# Patient Record
Sex: Male | Born: 1955 | Race: White | Hispanic: No | Marital: Married | Smoking: Former smoker
Health system: Southern US, Community
[De-identification: ages and names within clinical notes are randomized; demographics above are authoritative.]

## PROBLEM LIST (undated history)

## (undated) DIAGNOSIS — E119 Type 2 diabetes mellitus without complications: Secondary | ICD-10-CM

## (undated) DIAGNOSIS — R351 Nocturia: Secondary | ICD-10-CM

## (undated) DIAGNOSIS — J449 Chronic obstructive pulmonary disease, unspecified: Secondary | ICD-10-CM

## (undated) DIAGNOSIS — K219 Gastro-esophageal reflux disease without esophagitis: Secondary | ICD-10-CM

## (undated) DIAGNOSIS — D229 Melanocytic nevi, unspecified: Secondary | ICD-10-CM

## (undated) DIAGNOSIS — Z973 Presence of spectacles and contact lenses: Secondary | ICD-10-CM

## (undated) DIAGNOSIS — M1990 Unspecified osteoarthritis, unspecified site: Secondary | ICD-10-CM

## (undated) DIAGNOSIS — Z85828 Personal history of other malignant neoplasm of skin: Secondary | ICD-10-CM

## (undated) DIAGNOSIS — E0590 Thyrotoxicosis, unspecified without thyrotoxic crisis or storm: Secondary | ICD-10-CM

## (undated) DIAGNOSIS — C4491 Basal cell carcinoma of skin, unspecified: Secondary | ICD-10-CM

## (undated) DIAGNOSIS — E785 Hyperlipidemia, unspecified: Secondary | ICD-10-CM

## (undated) DIAGNOSIS — E0510 Thyrotoxicosis with toxic single thyroid nodule without thyrotoxic crisis or storm: Secondary | ICD-10-CM

## (undated) DIAGNOSIS — I10 Essential (primary) hypertension: Secondary | ICD-10-CM

## (undated) DIAGNOSIS — Z9889 Other specified postprocedural states: Secondary | ICD-10-CM

## (undated) HISTORY — PX: COLONOSCOPY: SHX174

## (undated) HISTORY — PX: LAPAROSCOPIC CHOLECYSTECTOMY: SUR755

## (undated) HISTORY — DX: Hyperlipidemia, unspecified: E78.5

## (undated) HISTORY — PX: KNEE ARTHROSCOPY: SUR90

## (undated) HISTORY — DX: Essential (primary) hypertension: I10

## (undated) HISTORY — DX: Gastro-esophageal reflux disease without esophagitis: K21.9

---
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
## 2003-10-28 ENCOUNTER — Emergency Department (HOSPITAL_COMMUNITY): Admission: EM | Admit: 2003-10-28 | Discharge: 2003-10-28 | Payer: Self-pay | Admitting: *Deleted

## 2003-10-29 ENCOUNTER — Ambulatory Visit (HOSPITAL_COMMUNITY): Admission: RE | Admit: 2003-10-29 | Discharge: 2003-10-29 | Payer: Self-pay | Admitting: Family Medicine

## 2003-10-31 ENCOUNTER — Ambulatory Visit (HOSPITAL_COMMUNITY): Admission: RE | Admit: 2003-10-31 | Discharge: 2003-10-31 | Payer: Self-pay | Admitting: Family Medicine

## 2003-11-06 ENCOUNTER — Emergency Department (HOSPITAL_COMMUNITY): Admission: EM | Admit: 2003-11-06 | Discharge: 2003-11-07 | Payer: Self-pay | Admitting: *Deleted

## 2003-11-08 ENCOUNTER — Ambulatory Visit (HOSPITAL_COMMUNITY): Admission: RE | Admit: 2003-11-08 | Discharge: 2003-11-08 | Payer: Self-pay | Admitting: General Surgery

## 2005-10-11 ENCOUNTER — Emergency Department (HOSPITAL_COMMUNITY): Admission: EM | Admit: 2005-10-11 | Discharge: 2005-10-11 | Payer: Self-pay | Admitting: Emergency Medicine

## 2006-03-14 ENCOUNTER — Ambulatory Visit (HOSPITAL_COMMUNITY): Payer: Self-pay | Admitting: Orthopedic Surgery

## 2006-03-14 ENCOUNTER — Ambulatory Visit: Payer: Self-pay | Admitting: Orthopedic Surgery

## 2006-03-14 ENCOUNTER — Encounter (HOSPITAL_COMMUNITY): Admission: RE | Admit: 2006-03-14 | Discharge: 2006-04-01 | Payer: Self-pay | Admitting: Orthopedic Surgery

## 2006-03-14 ENCOUNTER — Encounter: Payer: Self-pay | Admitting: Orthopedic Surgery

## 2006-03-16 ENCOUNTER — Ambulatory Visit: Payer: Self-pay | Admitting: Orthopedic Surgery

## 2006-03-21 ENCOUNTER — Ambulatory Visit: Payer: Self-pay | Admitting: Orthopedic Surgery

## 2006-03-28 ENCOUNTER — Ambulatory Visit: Payer: Self-pay | Admitting: Orthopedic Surgery

## 2007-02-07 ENCOUNTER — Encounter (INDEPENDENT_AMBULATORY_CARE_PROVIDER_SITE_OTHER): Payer: Self-pay | Admitting: General Surgery

## 2007-02-07 ENCOUNTER — Ambulatory Visit (HOSPITAL_COMMUNITY): Admission: RE | Admit: 2007-02-07 | Discharge: 2007-02-07 | Payer: Self-pay | Admitting: General Surgery

## 2007-12-05 ENCOUNTER — Ambulatory Visit (HOSPITAL_COMMUNITY): Admission: RE | Admit: 2007-12-05 | Discharge: 2007-12-05 | Payer: Self-pay | Admitting: Family Medicine

## 2009-03-24 ENCOUNTER — Ambulatory Visit: Payer: Self-pay | Admitting: Orthopedic Surgery

## 2009-03-24 DIAGNOSIS — M23329 Other meniscus derangements, posterior horn of medial meniscus, unspecified knee: Secondary | ICD-10-CM | POA: Insufficient documentation

## 2009-04-24 ENCOUNTER — Encounter: Payer: Self-pay | Admitting: Orthopedic Surgery

## 2009-04-25 ENCOUNTER — Ambulatory Visit: Payer: Self-pay | Admitting: Orthopedic Surgery

## 2009-04-25 ENCOUNTER — Ambulatory Visit (HOSPITAL_COMMUNITY): Admission: RE | Admit: 2009-04-25 | Discharge: 2009-04-25 | Payer: Self-pay | Admitting: Orthopedic Surgery

## 2009-04-25 ENCOUNTER — Telehealth: Payer: Self-pay | Admitting: Orthopedic Surgery

## 2009-04-29 ENCOUNTER — Ambulatory Visit: Payer: Self-pay | Admitting: Orthopedic Surgery

## 2009-04-29 ENCOUNTER — Telehealth: Payer: Self-pay | Admitting: Orthopedic Surgery

## 2009-04-29 DIAGNOSIS — IMO0002 Reserved for concepts with insufficient information to code with codable children: Secondary | ICD-10-CM | POA: Insufficient documentation

## 2009-04-29 DIAGNOSIS — M1710 Unilateral primary osteoarthritis, unspecified knee: Secondary | ICD-10-CM

## 2009-04-30 ENCOUNTER — Encounter: Payer: Self-pay | Admitting: Orthopedic Surgery

## 2009-04-30 ENCOUNTER — Encounter (HOSPITAL_COMMUNITY): Admission: RE | Admit: 2009-04-30 | Discharge: 2009-05-30 | Payer: Self-pay | Admitting: Orthopedic Surgery

## 2009-05-05 ENCOUNTER — Ambulatory Visit: Payer: Self-pay | Admitting: Orthopedic Surgery

## 2009-06-18 ENCOUNTER — Encounter: Payer: Self-pay | Admitting: Orthopedic Surgery

## 2010-07-30 DIAGNOSIS — D229 Melanocytic nevi, unspecified: Secondary | ICD-10-CM

## 2010-07-30 DIAGNOSIS — C4491 Basal cell carcinoma of skin, unspecified: Secondary | ICD-10-CM

## 2010-07-30 HISTORY — DX: Basal cell carcinoma of skin, unspecified: C44.91

## 2010-07-30 HISTORY — DX: Melanocytic nevi, unspecified: D22.9

## 2010-08-04 NOTE — Progress Notes (Signed)
Summary: Pre-cert not required for out-patient procedure  Phone Note Outgoing Call   Call placed to: Insurer Summary of Call: Per at [facility] ,[place], out-patient surgery scheduled at [facility] 04/25/09, CPT 29881/29880, does not require pre-certification. Initial call taken by: Cammie Sickle,  April 25, 2009 4:04 PM

## 2010-08-04 NOTE — Letter (Signed)
Summary: Surgery order RT knee sched 04/25/09  Surgery order RT knee sched 04/25/09   Imported By: Cammie Sickle 11/12/2009 11:02:31  _____________________________________________________________________  External Attachment:    Type:   Image     Comment:   External Document

## 2010-08-11 ENCOUNTER — Other Ambulatory Visit: Payer: Self-pay

## 2010-10-08 LAB — BASIC METABOLIC PANEL
CO2: 30 mEq/L (ref 19–32)
Creatinine, Ser: 1.01 mg/dL (ref 0.4–1.5)
Glucose, Bld: 113 mg/dL — ABNORMAL HIGH (ref 70–99)
Potassium: 4.1 mEq/L (ref 3.5–5.1)

## 2010-10-08 LAB — HEMOGLOBIN AND HEMATOCRIT, BLOOD
HCT: 42.8 % (ref 39.0–52.0)
Hemoglobin: 14.8 g/dL (ref 13.0–17.0)

## 2010-11-17 NOTE — H&P (Signed)
NAMETRAVELL, Cristian Nguyen               ACCOUNT NO.:  [id number]   MEDICAL RECORD NO.:  [id number]           PATIENT TYPE:  AMB   LOCATION:  DAY                           FACILITY:  [facility]   PHYSICIAN:  Dalia Heading, M.D.  DATE OF BIRTH:  Jun 04, 1956   DATE OF ADMISSION:  DATE OF DISCHARGE:                              HISTORY & PHYSICAL   CHIEF COMPLAINT:  Need screening colonoscopy.   HISTORY OF PRESENT ILLNESS:  The patient is a 54 year old white male who  is referred for endoscopic evaluation.  He needs a colonoscopy for  screening purposes.  No abdominal pain, weight loss, nausea, vomiting,  diarrhea, constipation, melena or hematochezia has been noted.  He has  never had a colonoscopy.  There is no family history of colon carcinoma.   PAST MEDICAL HISTORY:  Includes reflux disease.   PAST SURGICAL HISTORY:  Cholecystectomy.   CURRENT MEDICATIONS:  Nexium and Zocor.   ALLERGIES:  NO KNOWN DRUG ALLERGIES.   REVIEW OF SYSTEMS:  The patient smokes a pack of cigarettes a day.  He  drinks alcohol socially.  No other cardiopulmonary difficulties or  bleeding disorders have been noted.   PHYSICAL EXAMINATION:  GENERAL:  The patient is a well-developed, well-  nourished white male in no acute distress.  LUNGS:  Clear to auscultation with equal breath sounds bilaterally.  HEART:  Reveals a regular rate and rhythm without S3, S4 or murmurs.  ABDOMEN:  Soft, nontender and nondistended.  No hepatosplenomegaly or  masses are noted.  RECTAL:  Examination was deferred to the procedure.   IMPRESSION:  Need for screening colonoscopy.   PLAN:  The patient is scheduled for a colonoscopy on February 07, 2007.  The risks and benefits of the procedure including bleeding or  perforation were fully explained to the patient.  He gave informed  consent.      Dalia Heading, M.D.  Electronically Signed    MAJ/MEDQ  D:  01/19/2007  T:  01/20/2007  Job:  [id number]   cc:   Lorin Picket A. Gerda Diss, MD

## 2010-11-20 NOTE — Op Note (Signed)
Cristian Nguyen, Cristian Nguyen NO.:  [id number]   MEDICAL RECORD NO.:  [id number]                  PATIENT TYPE:   LOCATION:                                       FACILITY:   PHYSICIAN:  Dalia Heading, M.D.               DATE OF BIRTH:   DATE OF PROCEDURE:  11/08/2003  DATE OF DISCHARGE:                                 OPERATIVE REPORT   PREOPERATIVE DIAGNOSIS:  Chronic cholecystitis.   POSTOPERATIVE DIAGNOSIS:  Chronic cholecystitis.   PROCEDURE:  Laparoscopic cholecystectomy   SURGEON:  Dalia Heading, M.D.   ASSISTANT:  Buena Irish, M.D.   ANESTHESIA:  General endotracheal   INDICATIONS:  The patient is a 55 year old white male who is referred for  treatment of a chronic cholecystitis. The risks and benefits of the  procedure including bleeding, infection, hepatobiliary injury, and the  possibility of an open procedure were fully explained to the patient, who  gave informed consent.   DESCRIPTION OF PROCEDURE:  The patient was placed in the supine position.  After induction of general endotracheal anesthesia, the abdomen was prepped  and draped using the usual sterile technique with Betadine.  Surgical site  confirmation was performed.   A supraumbilical incision was made down to the fascia.  A Veress needle was  introduced into the abdominal cavity and confirmation of placement was done  using the saline drop test.  The abdomen was then insufflated to 16 mmHg  pressure.  An 11-mm trocar was introduced into the abdominal cavity under  direct visualization without difficulty.  The patient was placed in reverse  Trendelenburg position and an additional 11-mm trocar was placed in the  epigastric region and 5-mm trocars were placed in the right upper quadrant  and right flank regions. The liver was inspected and noted to be within  normal limits.   The gallbladder was retracted superiorly and laterally.  The dissection was  begun around  the infundibulum of the gallbladder.  The cystic duct was first  identified.  Its juncture to the infundibulum fully identified.  Endoclips  were placed proximally and distally on the cystic duct; and the cystic duct  was divided.  This was likewise done on the cystic artery.  The gallbladder  was then freed away from the gallbladder fossa using Bovie electrocautery.  The gallbladder was delivered through the epigastric trocar site using an  EndoCatch bag.  The gallbladder fossa was inspected and any bleeding was  controlled using Bovie electrocautery.  No bile leakage was noted.  Surgicel  was placed in the gallbladder fossa.  All fluid and air were then evacuated  from the abdominal cavity prior to removal of the trocars.   All wounds were irrigated with normal saline.  All wounds were injected with  0.5% Sensorcaine.  The supraumbilical fascia as well as epigastric fascia  were reapproximated using an #0 Vicryl interrupted suture.  All skin  incisions were closed using staples.  Betadine ointment and dry sterile  dressings were applied.   All tape and needle counts correct at the end of the procedure.  The patient  was extubated in the operating room and went back to recovery room awake in  stable condition.   COMPLICATIONS:  None.   SPECIMENS:  Gallbladder.   BLOOD LOSS:  Minimal.      ___________________________________________                                            Dalia Heading, M.D.   MAJ/MEDQ  D:  11/08/2003  T:  11/08/2003  Job:  [id number]   cc:   Dalia Heading, M.D.  [address], [place]  [place] [address]  Fax: [phone]   W. Simone Curia, M.D.  [address]  [place]  [place] [address]  Fax: [phone]

## 2012-02-22 NOTE — H&P (Signed)
NTS SOAP Note  Vital Signs:  Vitals as of: 02/22/2012: Systolic 156: Diastolic 103: Heart Rate 88: Temp 98.80F: Height 23ft 3in: Weight 256Lbs 0 Ounces: BMI 32  BMI : 32 kg/m2  Subjective: This 56 Years 2 Months old Male presents for follow up colonoscopy.  Had benign polyps removed from sigmoid colon in 2008.  No new gi problems. No family h/o colon cancer.    Review of Symptoms:  Constitutional:unremarkable   Head:unremarkable    Eyes:unremarkable   Nose/Mouth/Throat:unremarkable Cardiovascular:  unremarkable   Respiratory:unremarkable   Gastrointestinal:  unremarkable   Genitourinary:unremarkable     Musculoskeletal:unremarkable   Skin:unremarkable Hematolgic/Lymphatic:unremarkable     Allergic/Immunologic:unremarkable     Past Medical History:    Reviewed   Past Medical History      Not Available   Social History:Reviewed  Social History  Preferred Language: English ([place]) Race:  White Ethnicity: Not Hispanic / Latino Age: 56 Years 2 Months Marital Status:  M   Smoking Status: Unknown if ever smoked  Family History:  Reviewed   Family History      Not Available    Objective Information: General:  Well appearing, well nourished in no distress. Head:Atraumatic; no masses; no abnormalities Neck:  Supple without lymphadenopathy.  Heart:  RRR, no murmur Lungs:    CTA bilaterally, no wheezes, rhonchi, rales.  Breathing unlabored. Abdomen:Soft, NT/ND, no HSM, no masses.   deferred to procedure  Assessment:h/o colon polyps  Diagnosis &amp; Procedure: DiagnosisCode: V12.72, ProcedureCode: 19147,    Plan:Scheduled for TCS on 02/29/12.   Patient Education:Alternative treatments to surgery were discussed with patient (and family).  Risks and benefits  of procedure were fully explained to the patient (and family) who gave informed consent. Patient/family questions were  addressed.  Follow-up:Pending Surgery

## 2012-02-24 ENCOUNTER — Encounter (HOSPITAL_COMMUNITY): Payer: Self-pay

## 2012-02-28 MED ORDER — SODIUM CHLORIDE 0.9 % IV SOLN: INTRAVENOUS | Status: DC

## 2012-02-28 MED ORDER — SODIUM CHLORIDE 0.45 % IV SOLN
INTRAVENOUS | Status: DC
  Administered 2012-02-29: 10:00:00 via INTRAVENOUS

## 2012-02-29 ENCOUNTER — Encounter (HOSPITAL_COMMUNITY): Payer: Self-pay

## 2012-02-29 ENCOUNTER — Encounter (HOSPITAL_COMMUNITY)
Admission: RE | Disposition: A | Discharge status: Home / Self Care | Payer: Self-pay | Source: Ambulatory Visit | Attending: General Surgery

## 2012-02-29 ENCOUNTER — Ambulatory Visit (HOSPITAL_COMMUNITY)
Admission: RE | Admit: 2012-02-29 | Discharge: 2012-02-29 | Disposition: A | Discharge status: Home / Self Care | Payer: 59 | Source: Ambulatory Visit | Attending: General Surgery | Admitting: General Surgery

## 2012-02-29 DIAGNOSIS — Z860100 Personal history of colon polyps, unspecified: Secondary | ICD-10-CM | POA: Insufficient documentation

## 2012-02-29 DIAGNOSIS — D128 Benign neoplasm of rectum: Secondary | ICD-10-CM | POA: Insufficient documentation

## 2012-02-29 DIAGNOSIS — D129 Benign neoplasm of anus and anal canal: Secondary | ICD-10-CM | POA: Insufficient documentation

## 2012-02-29 HISTORY — PX: COLONOSCOPY: SHX5424

## 2012-02-29 SURGERY — COLONOSCOPY
Anesthesia: Moderate Sedation

## 2012-02-29 MED ORDER — MEPERIDINE HCL 50 MG/ML IJ SOLN
INTRAMUSCULAR | Status: AC
  Filled 2012-02-29: qty 1

## 2012-02-29 MED ORDER — STERILE WATER FOR IRRIGATION IR SOLN
Status: DC | PRN
  Administered 2012-02-29: 10:00:00

## 2012-02-29 MED ORDER — MIDAZOLAM HCL 5 MG/5ML IJ SOLN
INTRAMUSCULAR | Status: DC | PRN
  Administered 2012-02-29: 1 mg via INTRAVENOUS
  Administered 2012-02-29: 4 mg via INTRAVENOUS

## 2012-02-29 MED ORDER — MEPERIDINE HCL 25 MG/ML IJ SOLN
INTRAMUSCULAR | Status: DC | PRN
  Administered 2012-02-29: 50 mg via INTRAVENOUS

## 2012-02-29 MED ORDER — MIDAZOLAM HCL 5 MG/5ML IJ SOLN
INTRAMUSCULAR | Status: AC
  Filled 2012-02-29: qty 5

## 2012-02-29 NOTE — Interval H&P Note (Signed)
History and Physical Interval Note:  02/29/2012 10:14 AM  Cristian Nguyen  has presented today for surgery, with the diagnosis of Colon polyps  The various methods of treatment have been discussed with the patient and family. After consideration of risks, benefits and other options for treatment, the patient has consented to  Procedure(s) (LRB): COLONOSCOPY (N/A) as a surgical intervention .  The patient's history has been reviewed, patient examined, no change in status, stable for surgery.  I have reviewed the patient's chart and labs.  Questions were answered to the patient's satisfaction.     Franky Macho A

## 2012-02-29 NOTE — Op Note (Signed)
[facility] [address] [place], [address]   COLONOSCOPY PROCEDURE REPORT  PATIENT: Cristian Nguyen, Cristian Nguyen  MR#: [id number] BIRTHDATE: 1956/04/03 , 56  yrs. old GENDER: Male ENDOSCOPIST: Franky Macho, MD REFERRED XB:JYNWGN, Jeannett Senior PROCEDURE DATE:  02/29/2012 PROCEDURE:   Colonoscopy, surveillance ASA CLASS:   Class II INDICATIONS:follow up of colonic polyps. MEDICATIONS: Versed 5 mg IV and Demerol 50 mg IV  DESCRIPTION OF PROCEDURE:   After the risks benefits and alternatives of the procedure were thoroughly explained, informed consent was obtained.  A digital rectal exam revealed no abnormalities of the rectum.   The EC-[id number] ([id number])  endoscope was introduced through the anus and advanced to the cecum, which was identified by both the appendix and ileocecal valve. No adverse events experienced.   The quality of the prep was adequate, using Trilyte  The instrument was then slowly withdrawn as the colon was fully examined.      COLON FINDINGS: A small smooth sessile polyp was found at the cecum. A polypectomy was performed using snare cautery.  The resection was complete and the polyp tissue was not retrieved.   A diminutive smooth sessile polyp was found in the rectum.  A polypectomy was performed using snare cautery.  The resection was complete and the polyp tissue was completely retrieved.  Retroflexed views revealed no abnormalities. The time to cecum=6 minutes 0 seconds  Withdrawal time=10 minutes 0 seconds.  The scope was withdrawn and the procedure completed. COMPLICATIONS: There were no complications. ENDOSCOPIC IMPRESSION: 1.   Diminutive sessile polyp was found in the rectum; polypectomy was performed using snare cautery 2.   Cecal polyp, fulgerated, not retrieved  RECOMMENDATIONS: 1.  Await pathology results 2.  repeat Colonoscopy in 3 years.  eSigned:  Franky Macho, MD 02/29/2012 10:51 AM   cc: Simone Curia, Md

## 2012-03-03 ENCOUNTER — Encounter (HOSPITAL_COMMUNITY): Payer: Self-pay | Admitting: General Surgery

## 2012-12-05 ENCOUNTER — Other Ambulatory Visit: Payer: Self-pay | Admitting: Family Medicine

## 2013-03-29 ENCOUNTER — Other Ambulatory Visit: Payer: Self-pay | Admitting: *Deleted

## 2013-03-29 MED ORDER — ESOMEPRAZOLE MAGNESIUM 40 MG PO CPDR: 40.0000 mg | DELAYED_RELEASE_CAPSULE | Freq: Every day | ORAL | Status: DC

## 2013-03-29 MED ORDER — SIMVASTATIN 20 MG PO TABS: 20.0000 mg | ORAL_TABLET | Freq: Every day | ORAL | Status: DC

## 2013-03-31 ENCOUNTER — Encounter (HOSPITAL_COMMUNITY): Payer: Self-pay | Admitting: Emergency Medicine

## 2013-03-31 ENCOUNTER — Emergency Department (HOSPITAL_COMMUNITY)
Admission: EM | Admit: 2013-03-31 | Discharge: 2013-03-31 | Disposition: A | Discharge status: Home / Self Care | Payer: 59 | Attending: Emergency Medicine | Admitting: Emergency Medicine

## 2013-03-31 DIAGNOSIS — Z79899 Other long term (current) drug therapy: Secondary | ICD-10-CM | POA: Insufficient documentation

## 2013-03-31 DIAGNOSIS — R42 Dizziness and giddiness: Secondary | ICD-10-CM | POA: Insufficient documentation

## 2013-03-31 DIAGNOSIS — R51 Headache: Secondary | ICD-10-CM | POA: Insufficient documentation

## 2013-03-31 DIAGNOSIS — R110 Nausea: Secondary | ICD-10-CM | POA: Insufficient documentation

## 2013-03-31 DIAGNOSIS — Z87891 Personal history of nicotine dependence: Secondary | ICD-10-CM | POA: Insufficient documentation

## 2013-03-31 LAB — BASIC METABOLIC PANEL
BUN: 12 mg/dL (ref 6–23)
CO2: 28 mEq/L (ref 19–32)
Chloride: 102 mEq/L (ref 96–112)
Creatinine, Ser: 0.99 mg/dL (ref 0.50–1.35)
Glucose, Bld: 134 mg/dL — ABNORMAL HIGH (ref 70–99)
Potassium: 4.1 mEq/L (ref 3.5–5.1)

## 2013-03-31 LAB — CBC WITH DIFFERENTIAL/PLATELET
Eosinophils Relative: 1 % (ref 0–5)
Lymphocytes Relative: 36 % (ref 12–46)
Lymphs Abs: 2.3 10*3/uL (ref 0.7–4.0)
MCV: 86.2 fL (ref 78.0–100.0)
Neutrophils Relative %: 56 % (ref 43–77)
Platelets: 181 10*3/uL (ref 150–400)
RBC: 5.08 MIL/uL (ref 4.22–5.81)
WBC: 6.4 10*3/uL (ref 4.0–10.5)

## 2013-03-31 MED ORDER — ONDANSETRON HCL 4 MG PO TABS: 4.0000 mg | ORAL_TABLET | Freq: Three times a day (TID) | ORAL | Status: DC | PRN

## 2013-03-31 MED ORDER — MECLIZINE HCL 25 MG PO TABS: ORAL_TABLET | ORAL | Status: DC

## 2013-03-31 MED ORDER — MECLIZINE HCL 12.5 MG PO TABS
25.0000 mg | ORAL_TABLET | Freq: Once | ORAL | Status: AC
  Administered 2013-03-31: 25 mg via ORAL
  Filled 2013-03-31: qty 2

## 2013-03-31 NOTE — ED Notes (Signed)
Pt c/o sudden dizziness at 9am. Dizziness does not change with movement. Pt is stable with ambulation. Nausea. Pt c/o neck stiffness to left side and posterior neck this am also. Pt states he felt "clammy" earlier. nad at this time.

## 2013-03-31 NOTE — ED Provider Notes (Signed)
CSN: [id number]     Arrival date & time 03/31/13  1030 History  This chart was scribed for Ward Givens, MD,  by Ashley Jacobs, ED Scribe. The patient was seen in room [facility] and the patient's care was started at 12:06 PM.    First MD Initiated Contact with Patient 03/31/13 1108     Chief Complaint  Patient presents with  . Dizziness   (Consider location/radiation/quality/duration/timing/severity/associated sxs/prior Treatment) The history is provided by the patient and medical records. No language interpreter was used.   HPI Comments: Cristian Nguyen is a 57 y.o. male who presents to the Emergency Department complaining of dizziness that presented 3 hours PTA after getting out of the shower. He states he felt fine when he got up this morning and actually for a couple hours after he took his shower. He states he has a dizziness which he states is a feeling of movement and he feels off balance. He has nausea without vomiting. He states he does have some stiffness in the upper back of his neck that is sore that has come and gone at the end of his symptoms. He states he has a mild headache and points to the posterior part of his head which he thinks is sinus disease. He denies numbness or tingling in his extremities. He denies any weakness in his arms or legs and is walking normally although his wife thought he might be off balance. He states he's had symptoms before, the last time was about 6 months ago and the time before that was about 2 years ago. He states however it didn't last as long today. His wife reports his had normal speech today. He states closing his eyes makes him feel like his spinning is worse and also standing up. They went to the pharmacy and checked his blood pressure and it was 148/92,his wife reports he was a little bit clammy. Pt states feeling better during the exam and the symptoms have improved.    No hx of strokes.   Pt is has retired from smoking   PCP Dr  Gerda Diss   History reviewed. No pertinent past medical history. Past Surgical History  Procedure Laterality Date  . Knee arthroscopy  2010  . Cholecystectomy    . Colonoscopy  02/29/2012    Procedure: COLONOSCOPY;  Surgeon: Dalia Heading, MD;  Location: [facility];  Service: Gastroenterology;  Laterality: N/A;   History reviewed. No pertinent family history. History  Substance Use Topics  . Smoking status: Former Smoker -- 35 years    Types: Cigarettes    Quit date: 02/22/2012  . Smokeless tobacco: Not on file  . Alcohol Use: No  lives at home Lives with spouse Employed Quit smoking  Review of Systems  Gastrointestinal: Positive for nausea. Negative for vomiting.  Musculoskeletal: Positive for gait problem.  Neurological: Positive for dizziness and headaches. Negative for syncope, facial asymmetry, weakness and numbness.  Psychiatric/Behavioral: Negative for confusion.  All other systems reviewed and are negative.    Allergies  Review of patient's allergies indicates no known allergies.  Home Medications   Current Outpatient Rx  Name  Route  Sig  Dispense  Refill  . esomeprazole (NEXIUM) 40 MG capsule   Oral   Take 1 capsule (40 mg total) by mouth daily before breakfast.   30 capsule   0     Needs office visit.   Marland Kitchen simvastatin (ZOCOR) 20 MG tablet   Oral   Take 1  tablet (20 mg total) by mouth at bedtime.   30 tablet   0     Needs office visit.    BP 135/92  Pulse 67  Temp(Src) 97.7 F (36.5 C) (Oral)  Resp 16  SpO2 95%  Vital signs normal   Physical Exam  Nursing note and vitals reviewed. Constitutional: He is oriented to person, place, and time. He appears well-developed and well-nourished.  Non-toxic appearance. He does not appear ill. No distress.  HENT:  Head: Normocephalic and atraumatic.  Right Ear: External ear normal.  Left Ear: External ear normal.  Nose: Nose normal. No mucosal edema or rhinorrhea.  Mouth/Throat: Oropharynx is clear  and moist and mucous membranes are normal. No dental abscesses or edematous.  Eyes: Conjunctivae and EOM are normal. Pupils are equal, round, and reactive to light.  No nystagmus  Neck: Normal range of motion and full passive range of motion without pain. Neck supple.  Cardiovascular: Normal rate, regular rhythm and normal heart sounds.  Exam reveals no gallop and no friction rub.   No murmur heard. Pulmonary/Chest: Effort normal and breath sounds normal. No respiratory distress. He has no wheezes. He has no rhonchi. He has no rales. He exhibits no tenderness and no crepitus.  Abdominal: Soft. Normal appearance and bowel sounds are normal. He exhibits no distension. There is no tenderness. There is no rebound and no guarding.  Musculoskeletal: Normal range of motion. He exhibits no edema and no tenderness.  Moves all extremities well.   Neurological: He is alert and oriented to person, place, and time. He has normal strength. No cranial nerve deficit.  No pronator drift Heel to shin normal bilaterally Normal grips  Skin: Skin is warm, dry and intact. No rash noted. No erythema. No pallor.  Psychiatric: He has a normal mood and affect. His speech is normal and behavior is normal. His mood appears not anxious.    ED Course  Procedures (including critical care time)  Medications  meclizine (ANTIVERT) tablet 25 mg (25 mg Oral Given 03/31/13 1243)    DIAGNOSTIC STUDIES: Oxygen Saturation is 95% on room air, adequate by my interpretation.    COORDINATION OF CARE: 12:12 PM Discussed course of care with pt which includes antivert. Pt understands and agrees.  Patient presents with symptoms compatible with vertigo. He has no worrisome symptoms such as severe headache or inability to walk. At this Point emergent MRI is not felt to be needed.  Labs Review Results for orders placed during the hospital encounter of 03/31/13  CBC WITH DIFFERENTIAL      Result Value Range   WBC 6.4  4.0 - 10.5  K/uL   RBC 5.08  4.22 - 5.81 MIL/uL   Hemoglobin 15.3  13.0 - 17.0 g/dL   HCT 16.1  09.6 - 04.5 %   MCV 86.2  78.0 - 100.0 fL   MCH 30.1  26.0 - 34.0 pg   MCHC 34.9  30.0 - 36.0 g/dL   RDW 40.9  81.1 - 91.4 %   Platelets 181  150 - 400 K/uL   Neutrophils Relative % 56  43 - 77 %   Neutro Abs 3.6  1.7 - 7.7 K/uL   Lymphocytes Relative 36  12 - 46 %   Lymphs Abs 2.3  0.7 - 4.0 K/uL   Monocytes Relative 7  3 - 12 %   Monocytes Absolute 0.5  0.1 - 1.0 K/uL   Eosinophils Relative 1  0 - 5 %  Eosinophils Absolute 0.1  0.0 - 0.7 K/uL   Basophils Relative 0  0 - 1 %   Basophils Absolute 0.0  0.0 - 0.1 K/uL  BASIC METABOLIC PANEL      Result Value Range   Sodium 138  135 - 145 mEq/L   Potassium 4.1  3.5 - 5.1 mEq/L   Chloride 102  96 - 112 mEq/L   CO2 28  19 - 32 mEq/L   Glucose, Bld 134 (*) 70 - 99 mg/dL   BUN 12  6 - 23 mg/dL   Creatinine, Ser 4.54  0.50 - 1.35 mg/dL   Calcium 9.2  8.4 - 09.8 mg/dL   GFR calc non Af Amer 90 (*) >90 mL/min   GFR calc Af Amer >90  >90 mL/min  GLUCOSE, CAPILLARY      Result Value Range   Glucose-Capillary 117 (*) 70 - 99 mg/dL   Laboratory interpretation all normal    Date: 03/31/2013  Rate: 64  Rhythm: normal sinus rhythm  QRS Axis: normal  Intervals: normal  ST/T Wave abnormalities: normal  Conduction Disutrbances:none  Narrative Interpretation:   Old EKG Reviewed: unchanged from 04/21/2009    MDM   1. Vertigo    Discharge Medication List as of 03/31/2013 12:34 PM    START taking these medications   Details  meclizine (ANTIVERT) 25 MG tablet Take 1 or 2 po Q 6hrs for dizziness, Print    ondansetron (ZOFRAN) 4 MG tablet Take 1 tablet (4 mg total) by mouth every 8 (eight) hours as needed for nausea (or dizziness)., Starting 03/31/2013, Until Discontinued, Print        Plan discharge   Devoria Albe, MD, FACEP   I personally performed the services described in this documentation, which was scribed in my presence. The recorded  information has been reviewed and considered.  Devoria Albe, MD, FACEP      Ward Givens, MD 03/31/13 [phone]

## 2013-04-30 ENCOUNTER — Telehealth: Payer: Self-pay | Admitting: Family Medicine

## 2013-04-30 DIAGNOSIS — E782 Mixed hyperlipidemia: Secondary | ICD-10-CM

## 2013-04-30 DIAGNOSIS — Z79899 Other long term (current) drug therapy: Secondary | ICD-10-CM

## 2013-04-30 DIAGNOSIS — Z125 Encounter for screening for malignant neoplasm of prostate: Secondary | ICD-10-CM

## 2013-04-30 NOTE — Telephone Encounter (Signed)
Patient has an appointment Tuesday November 4 for Med Check and he would like to have Blood Work completed early this week.  Please call patient when he can go to [facility] to have this blood work completed.

## 2013-05-02 NOTE — Telephone Encounter (Signed)
Pt would like to know if he needs any additional labs to the ones he had done in Sept at the Hospital, if so can we please get them ordered an call him? He has an appt on 11/4 but is going out of town for till then this Friday. Thanks for your attention

## 2013-05-02 NOTE — Telephone Encounter (Signed)
Lip liv psa glu

## 2013-05-02 NOTE — Telephone Encounter (Signed)
Blood work ordered in Epic. Patient notified. 

## 2013-05-03 LAB — HEPATIC FUNCTION PANEL
ALT: 23 U/L (ref 0–53)
Albumin: 4.1 g/dL (ref 3.5–5.2)
Alkaline Phosphatase: 63 U/L (ref 39–117)
Total Protein: 6.4 g/dL (ref 6.0–8.3)

## 2013-05-03 LAB — LIPID PANEL
HDL: 34 mg/dL — ABNORMAL LOW (ref >39)
LDL Cholesterol: 101 mg/dL — ABNORMAL HIGH (ref 0–99)
Total CHOL/HDL Ratio: 5.3 Ratio
Triglycerides: 226 mg/dL — ABNORMAL HIGH (ref <150)
VLDL: 45 mg/dL — ABNORMAL HIGH (ref 0–40)

## 2013-05-03 LAB — GLUCOSE, RANDOM: Glucose, Bld: 110 mg/dL — ABNORMAL HIGH (ref 70–99)

## 2013-05-03 LAB — PSA: PSA: 1.14 ng/mL (ref <=4.00)

## 2013-05-08 ENCOUNTER — Ambulatory Visit (INDEPENDENT_AMBULATORY_CARE_PROVIDER_SITE_OTHER): Payer: 59 | Admitting: Family Medicine

## 2013-05-08 ENCOUNTER — Encounter: Payer: Self-pay | Admitting: Family Medicine

## 2013-05-08 VITALS — BP 168/98 | Ht 74.0 in | Wt 268.8 lb

## 2013-05-08 DIAGNOSIS — R7301 Impaired fasting glucose: Secondary | ICD-10-CM

## 2013-05-08 DIAGNOSIS — K219 Gastro-esophageal reflux disease without esophagitis: Secondary | ICD-10-CM

## 2013-05-08 DIAGNOSIS — E785 Hyperlipidemia, unspecified: Secondary | ICD-10-CM | POA: Insufficient documentation

## 2013-05-08 DIAGNOSIS — Z23 Encounter for immunization: Secondary | ICD-10-CM

## 2013-05-08 DIAGNOSIS — I10 Essential (primary) hypertension: Secondary | ICD-10-CM

## 2013-05-08 MED ORDER — ESOMEPRAZOLE MAGNESIUM 40 MG PO CPDR: 40.0000 mg | DELAYED_RELEASE_CAPSULE | Freq: Every day | ORAL | Status: DC

## 2013-05-08 MED ORDER — SIMVASTATIN 20 MG PO TABS: 20.0000 mg | ORAL_TABLET | Freq: Every day | ORAL | Status: DC

## 2013-05-08 NOTE — Patient Instructions (Signed)
Hypertension As your heart beats, it forces blood through your arteries. This force is your blood pressure. If the pressure is too high, it is called hypertension (HTN) or high blood pressure. HTN is dangerous because you may have it and not know it. High blood pressure may mean that your heart has to work harder to pump blood. Your arteries may be narrow or stiff. The extra work puts you at risk for heart disease, stroke, and other problems.  Blood pressure consists of two numbers, a higher number over a lower, 110/72, for example. It is stated as "110 over 72." The ideal is below 120 for the top number (systolic) and under 80 for the bottom (diastolic). Write down your blood pressure today. You should pay close attention to your blood pressure if you have certain conditions such as: Heart failure. Prior heart attack. Diabetes Chronic kidney disease. Prior stroke. Multiple risk factors for heart disease. To see if you have HTN, your blood pressure should be measured while you are seated with your arm held at the level of the heart. It should be measured at least twice. A one-time elevated blood pressure reading (especially in the Emergency Department) does not mean that you need treatment. There may be conditions in which the blood pressure is different between your right and left arms. It is important to see your caregiver soon for a recheck. Most people have essential hypertension which means that there is not a specific cause. This type of high blood pressure may be lowered by changing lifestyle factors such as: Stress. Smoking. Lack of exercise. Excessive weight. Drug/tobacco/alcohol use. Eating less salt. Most people do not have symptoms from high blood pressure until it has caused damage to the body. Effective treatment can often prevent, delay or reduce that damage. TREATMENT  When a cause has been identified, treatment for high blood pressure is directed at the cause. There are a large  number of medications to treat HTN. These fall into several categories, and your caregiver will help you select the medicines that are best for you. Medications may have side effects. You should review side effects with your caregiver. If your blood pressure stays high after you have made lifestyle changes or started on medicines,  Your medication(s) may need to be changed. Other problems may need to be addressed. Be certain you understand your prescriptions, and know how and when to take your medicine. Be sure to follow up with your caregiver within the time frame advised (usually within two weeks) to have your blood pressure rechecked and to review your medications. If you are taking more than one medicine to lower your blood pressure, make sure you know how and at what times they should be taken. Taking two medicines at the same time can result in blood pressure that is too low. SEEK IMMEDIATE MEDICAL CARE IF: You develop a severe headache, blurred or changing vision, or confusion. You have unusual weakness or numbness, or a faint feeling. You have severe chest or abdominal pain, vomiting, or breathing problems. MAKE SURE YOU:  Understand these instructions. Will watch your condition. Will get help right away if you are not doing well or get worse. Document Released: 06/21/2005 Document Revised: 09/13/2011 Document Reviewed: 02/09/2008 [facility] Patient Information 2014 [place], [place]. Sodium-Controlled Diet Sodium is a mineral. It is found in many foods. Sodium may be found naturally or added during the making of a food. The most common form of sodium is salt, which is made up of sodium  and chloride. Reducing your sodium intake involves changing your eating habits. The following guidelines will help you reduce the sodium in your diet:  Stop using the salt shaker.  Use salt sparingly in cooking and baking.  Substitute with sodium-free seasonings and spices.  Do not use a salt substitute  (potassium chloride) without your caregiver's permission.  Include a variety of fresh, unprocessed foods in your diet.  Limit the use of processed and convenience foods that are high in sodium. USE THE FOLLOWING FOODS SPARINGLY: Breads/Starches  Commercial bread stuffing, commercial pancake or waffle mixes, coating mixes. Waffles. Croutons. Prepared (boxed or frozen) potato, rice, or noodle mixes that contain salt or sodium. Salted [place] fries or hash browns. Salted popcorn, breads, crackers, chips, or snack foods. Vegetables  Vegetables canned with salt or prepared in cream, butter, or cheese sauces. Sauerkraut. Tomato or vegetable juices canned with salt.  Fresh vegetables are allowed if rinsed thoroughly. Fruit  Fruit is okay to eat. Meat and Meat Substitutes  Salted or smoked meats, such as bacon or Canadian bacon, chipped or corned beef, hot dogs, salt pork, luncheon meats, pastrami, ham, or sausage. Canned or smoked fish, poultry, or meat. Processed cheese or cheese spreads, blue or Roquefort cheese. Battered or frozen fish products. Prepared spaghetti sauce. Baked beans. Reuben sandwiches. Salted nuts. Caviar. Milk  Limit buttermilk to 1 cup per week. Soups and Combination Foods  Bouillon cubes, canned or dried soups, broth, consomm. Convenience (frozen or packaged) dinners with more than 600 mg sodium. Pot pies, pizza, Asian food, fast food cheeseburgers, and specialty sandwiches. Desserts and Sweets  Regular (salted) desserts, pie, commercial fruit snack pies, commercial snack cakes, canned puddings.  Eat desserts and sweets in moderation. Fats and Oils  Gravy mixes or canned gravy. No more than 1 to 2 tbs of salad dressing. Chip dips.  Eat fats and oils in moderation. Beverages  See those listed under the vegetables and milk groups. Condiments  Ketchup, mustard, meat sauces, salsa, regular (salted) and lite soy sauce or mustard. Dill pickles, olives, meat  tenderizer. Prepared horseradish or pickle relish. Dutch-processed cocoa. Baking powder or baking soda used medicinally. Worcestershire sauce. "Light" salt. Salt substitute, unless approved by your caregiver. Document Released: 12/11/2001 Document Revised: 09/13/2011 Document Reviewed: 07/14/2009 [facility] Patient Information 2014 [place], [place].

## 2013-05-08 NOTE — Progress Notes (Signed)
  Subjective:    Patient ID: Cristian Nguyen, male    DOB: 12-Sep-1955, 57 y.o.   MRN: [id number]  HPI Pt is here today for a check up.Blood pressure high at times. Other times blood pressure in good range. Trying to watch salt intake.  Recently in hosp for spell f vertigo. Dizziness lasted several days. Was in the emergency room. Improved after several days. Meclizine and Zofran helped.  Trying to exercis some.  Results for orders placed in visit on 04/30/13  LIPID PANEL      Result Value Range   Cholesterol 180  0 - 200 mg/dL   Triglycerides 846 (*) <150 mg/dL   HDL 34 (*) >96 mg/dL   Total CHOL/HDL Ratio 5.3     VLDL 45 (*) 0 - 40 mg/dL   LDL Cholesterol 295 (*) 0 - 99 mg/dL  HEPATIC FUNCTION PANEL      Result Value Range   Total Bilirubin 0.7  0.3 - 1.2 mg/dL   Bilirubin, Direct 0.1  0.0 - 0.3 mg/dL   Indirect Bilirubin 0.6  0.0 - 0.9 mg/dL   Alkaline Phosphatase 63  39 - 117 U/L   AST 18  0 - 37 U/L   ALT 23  0 - 53 U/L   Total Protein 6.4  6.0 - 8.3 g/dL   Albumin 4.1  3.5 - 5.2 g/dL  PSA      Result Value Range   PSA 1.14  <=4.00 ng/mL  GLUCOSE, RANDOM      Result Value Range   Glucose, Bld 110 (*) 70 - 99 mg/dL   Patient notes reflux overall is better. Takes the Nexium faithfully. He definitely helps him.  Claims compliance with her generic Zocor. Feels that it may be causing some achiness in joints. Significant family history of heart disease.  Fortunately not smoking  He had blood work done.  Needs refills on meds.     Review of Systems No chest pain no headache no back pain no shortness of breath no abdominal pain no change in bowel habits no blood in stool    Objective:   Physical Exam Alert HEENT normal. Blood pressure 145/92 on repeat. Lungs clear. Heart regular in rhythm. Ankles without edema. Neuro intact.       Assessment & Plan:  Impression 1 vertigo resolved #2 hyperlipidemia numbers overall good discussed. #3 hypertriglyceridemia numbers not  high enough to warrant additional medicine discussed. #4 impaired fasting glucose discussed. #5 elevated blood pressure time to call and hypertension. Plantars discussed. If still elevated in several months Y. medication. Plan as per orders. Diet exercise discussed. WSL

## 2013-07-09 ENCOUNTER — Ambulatory Visit (INDEPENDENT_AMBULATORY_CARE_PROVIDER_SITE_OTHER): Payer: 59 | Admitting: Family Medicine

## 2013-07-09 ENCOUNTER — Encounter: Payer: Self-pay | Admitting: Family Medicine

## 2013-07-09 VITALS — BP 112/88 | Temp 101.5°F | Ht 74.0 in | Wt 260.0 lb

## 2013-07-09 DIAGNOSIS — J329 Chronic sinusitis, unspecified: Secondary | ICD-10-CM

## 2013-07-09 MED ORDER — LEVOFLOXACIN 500 MG PO TABS: 500.0000 mg | ORAL_TABLET | Freq: Every day | ORAL | Status: AC

## 2013-07-09 NOTE — Progress Notes (Signed)
   Subjective:    Patient ID: Cristian Nguyen, male    DOB: 06-Jan-1956, 58 y.o.   MRN: [id number]  Fever  This is a new problem. The current episode started 1 to 4 weeks ago. The problem occurs intermittently. The problem has been unchanged. The maximum temperature noted was 101 to 101.9 F. The temperature was taken using an oral thermometer. Associated symptoms include congestion, coughing, headaches, muscle aches and sleepiness. Treatments tried: mucinex. The treatment provided no relief.    Chills achey and fever.  Felt bad with it  Diminished energy, coughin up gunky stuff and diminished energy  Review of Systems  Constitutional: Positive for fever.  HENT: Positive for congestion.   Respiratory: Positive for cough.   Neurological: Positive for headaches.   see below     Objective:   Physical Exam  Alert hydration good. Fever present. H&T moderate his congestion pharynx normal neck supple. Lungs no tachypnea no wheezes no crackles heart regular in rhythm.      Assessment & Plan:  No vomiting or diarrhea ROS otherwise negative impression post viral sinusitis and bronchitis doubt pneumonia though will cover. Plan appropriate antibiotics. Symptomatic care discussed. WSL

## 2013-07-23 DIAGNOSIS — Z0289 Encounter for other administrative examinations: Secondary | ICD-10-CM

## 2013-08-08 ENCOUNTER — Encounter: Payer: 59 | Admitting: Family Medicine

## 2013-08-23 ENCOUNTER — Ambulatory Visit (INDEPENDENT_AMBULATORY_CARE_PROVIDER_SITE_OTHER): Payer: 59 | Admitting: Family Medicine

## 2013-08-23 ENCOUNTER — Encounter: Payer: Self-pay | Admitting: Family Medicine

## 2013-08-23 VITALS — BP 132/90 | HR 70 | Ht 73.75 in | Wt 264.0 lb

## 2013-08-23 DIAGNOSIS — Z0000 Encounter for general adult medical examination without abnormal findings: Secondary | ICD-10-CM

## 2013-08-23 DIAGNOSIS — Z23 Encounter for immunization: Secondary | ICD-10-CM

## 2013-08-23 MED ORDER — AMOXICILLIN-POT CLAVULANATE 875-125 MG PO TABS: 1.0000 | ORAL_TABLET | Freq: Two times a day (BID) | ORAL | Status: DC

## 2013-08-23 MED ORDER — SIMVASTATIN 20 MG PO TABS: 20.0000 mg | ORAL_TABLET | Freq: Every day | ORAL | Status: DC

## 2013-08-23 MED ORDER — ESOMEPRAZOLE MAGNESIUM 40 MG PO CPDR: 40.0000 mg | DELAYED_RELEASE_CAPSULE | Freq: Every day | ORAL | Status: DC

## 2013-08-23 NOTE — Progress Notes (Signed)
Subjective:    Patient ID: Cristian Nguyen, male    DOB: 06-29-1956, 58 y.o.   MRN: [id number]  HPIphysical.   Having  Green nasal drainage.   Results for orders placed in visit on 04/30/13  LIPID PANEL      Result Value Ref Range   Cholesterol 180  0 - 200 mg/dL   Triglycerides 226 (*) <150 mg/dL   HDL 34 (*) >39 mg/dL   Total CHOL/HDL Ratio 5.3     VLDL 45 (*) 0 - 40 mg/dL   LDL Cholesterol 101 (*) 0 - 99 mg/dL  HEPATIC FUNCTION PANEL      Result Value Ref Range   Total Bilirubin 0.7  0.3 - 1.2 mg/dL   Bilirubin, Direct 0.1  0.0 - 0.3 mg/dL   Indirect Bilirubin 0.6  0.0 - 0.9 mg/dL   Alkaline Phosphatase 63  39 - 117 U/L   AST 18  0 - 37 U/L   ALT 23  0 - 53 U/L   Total Protein 6.4  6.0 - 8.3 g/dL   Albumin 4.1  3.5 - 5.2 g/dL  PSA      Result Value Ref Range   PSA 1.14  <=4.00 ng/mL  GLUCOSE, RANDOM      Result Value Ref Range   Glucose, Bld 110 (*) 70 - 99 mg/dL    BP elevated at times  chol med faithfully  Diet so so, good appetite  Stays active at work,  Mid abd bulging with sitting   Reflux stablw on emeds,,  No obvious fever. Congestion and drainage at times.   Started yesterday.     Review of Systems  Constitutional: Negative for fever, activity change and appetite change.  HENT: Negative for congestion and rhinorrhea.   Eyes: Negative for discharge.  Respiratory: Negative for cough and wheezing.   Cardiovascular: Negative for chest pain.  Gastrointestinal: Negative for vomiting, abdominal pain and blood in stool.  Genitourinary: Negative for frequency and difficulty urinating.  Musculoskeletal: Negative for neck pain.  Skin: Negative for rash.  Allergic/Immunologic: Negative for environmental allergies and food allergies.  Neurological: Negative for weakness and headaches.  Psychiatric/Behavioral: Negative for agitation.  All other systems reviewed and are negative.       Objective:   Physical Exam  Vitals reviewed. Constitutional:  He appears well-developed and well-nourished.  HENT:  Head: Normocephalic and atraumatic.  Right Ear: External ear normal.  Left Ear: External ear normal.  Nose: Nose normal.  Mouth/Throat: Oropharynx is clear and moist.  Moderate nasal congestion. Frontal fullness. Occasional cough during exam.  Eyes: EOM are normal. Pupils are equal, round, and reactive to light.  Neck: Normal range of motion. Neck supple. No thyromegaly present.  Cardiovascular: Normal rate, regular rhythm and normal heart sounds.   No murmur heard. Pulmonary/Chest: Effort normal and breath sounds normal. No respiratory distress. He has no wheezes.  Abdominal: Soft. Bowel sounds are normal. He exhibits no distension and no mass. There is no tenderness.  Genitourinary: Penis normal.  Musculoskeletal: Normal range of motion. He exhibits no edema.  Lymphadenopathy:    He has no cervical adenopathy.  Neurological: He is alert. He exhibits normal muscle tone.  Skin: Skin is warm and dry. No erythema.  Psychiatric: He has a normal mood and affect. His behavior is normal. Judgment normal.          Assessment & Plan:  Impression 1 well notes s exam. #2 hypertension good control. #3 rhinosinusitis. #4 hyperlipidemia. #  5 reflux clinically stable Plan maintain same medications. Add Augmentin twice a day 10 days. Symptomatic care discussed. Up-to-date on colonoscopy. Followup as scheduled. WSL

## 2013-11-14 ENCOUNTER — Telehealth (HOSPITAL_COMMUNITY): Payer: Self-pay | Admitting: Interventional Radiology

## 2013-12-19 ENCOUNTER — Ambulatory Visit (INDEPENDENT_AMBULATORY_CARE_PROVIDER_SITE_OTHER): Payer: 59 | Admitting: Family Medicine

## 2013-12-19 ENCOUNTER — Encounter: Payer: Self-pay | Admitting: Family Medicine

## 2013-12-19 VITALS — BP 132/90 | Temp 98.2°F | Ht 74.0 in | Wt 269.0 lb

## 2013-12-19 DIAGNOSIS — R55 Syncope and collapse: Secondary | ICD-10-CM

## 2013-12-19 DIAGNOSIS — L500 Allergic urticaria: Secondary | ICD-10-CM

## 2013-12-19 MED ORDER — TRIAMCINOLONE ACETONIDE 0.1 % EX CREA: 1.0000 "application " | TOPICAL_CREAM | Freq: Two times a day (BID) | CUTANEOUS | Status: DC

## 2013-12-19 MED ORDER — PREDNISONE 20 MG PO TABS: ORAL_TABLET | ORAL | Status: DC

## 2013-12-19 NOTE — Patient Instructions (Signed)
Take daily antihist for 7 d claritin or zyrtec or etc.  The spell was a vasovagal episode i e near fainting spell  Cons staying out of heat next few days and incr fluid intake

## 2013-12-19 NOTE — Progress Notes (Signed)
   Subjective:    Patient ID: Cristian Nguyen, male    DOB: 09/20/1955, 58 y.o.   MRN: [id number]  HPI Comments: Woke up at 1 am and 3 am with an itchy rash all over his body. He then got really dizzy and light headed.   Dizziness This is a new problem. The current episode started today. Episode frequency: 90 seconds. Associated symptoms include diaphoresis, headaches, nausea and a rash. Nothing (Was working in the garden yesterday) aggravates the symptoms. Treatments tried: cortizone salve. The treatment provided moderate relief.   Patient presents with 2 chief problems. Experienced a dizzy episode. Felt very lightheaded. Felt like he was given a pass out. Felt slightly nauseated. This occurred after having suffered an attack of hives. Patient had also just got up. Also admits to diminished fluid intake recent days. Working out in the heat. Also had just taken a shower when he experienced this lightheaded spell.  1 history of significant hives in the past. No new food intake no medications no new exposures. Admits to some ongoing stress but nothing new.   Review of Systems  Constitutional: Positive for diaphoresis.  Gastrointestinal: Positive for nausea.  Skin: Positive for rash.  Neurological: Positive for dizziness and headaches.       Objective:   Physical Exam Alert no acute distress. HEENT normal. Lungs clear. Heart regular rate and rhythm. Neuro exam intact. Blood pressure is good. Bilateral flanks some residual hives       Assessment & Plan:  Impression 1 vasovagal episode along discussion held in the nature of this. Multiple factors leading to it. #2 hives also long discussion held plan easily 25 minutes spent most in discussion. Prednisone. Daily antihistamine. Avoidance measures discussed. WSL

## 2014-03-07 ENCOUNTER — Encounter: Payer: Self-pay | Admitting: Family Medicine

## 2014-03-07 ENCOUNTER — Ambulatory Visit (INDEPENDENT_AMBULATORY_CARE_PROVIDER_SITE_OTHER): Payer: 59 | Admitting: Family Medicine

## 2014-03-07 VITALS — BP 140/80 | Temp 97.8°F | Ht 74.0 in | Wt 264.0 lb

## 2014-03-07 DIAGNOSIS — I10 Essential (primary) hypertension: Secondary | ICD-10-CM

## 2014-03-07 DIAGNOSIS — K219 Gastro-esophageal reflux disease without esophagitis: Secondary | ICD-10-CM

## 2014-03-07 DIAGNOSIS — E785 Hyperlipidemia, unspecified: Secondary | ICD-10-CM

## 2014-03-07 DIAGNOSIS — Z79899 Other long term (current) drug therapy: Secondary | ICD-10-CM

## 2014-03-07 DIAGNOSIS — R7301 Impaired fasting glucose: Secondary | ICD-10-CM

## 2014-03-07 MED ORDER — SIMVASTATIN 20 MG PO TABS: 20.0000 mg | ORAL_TABLET | Freq: Every day | ORAL | Status: DC

## 2014-03-07 MED ORDER — ONDANSETRON 4 MG PO TBDP: 4.0000 mg | ORAL_TABLET | Freq: Four times a day (QID) | ORAL | Status: DC | PRN

## 2014-03-07 MED ORDER — ESOMEPRAZOLE MAGNESIUM 40 MG PO CPDR: 40.0000 mg | DELAYED_RELEASE_CAPSULE | Freq: Every day | ORAL | Status: DC

## 2014-03-07 MED ORDER — MECLIZINE HCL 25 MG PO TABS
25.0000 mg | ORAL_TABLET | Freq: Four times a day (QID) | ORAL | Status: DC | PRN
Start: 2014-03-07 — End: 2015-11-06

## 2014-03-07 NOTE — Patient Instructions (Signed)
Benadryl 25 nightly will help sleep

## 2014-03-07 NOTE — Progress Notes (Signed)
   Subjective:    Patient ID: Cristian Nguyen, male    DOB: 19-Dec-1955, 58 y.o.   MRN: [id number]  Dizziness This is a new problem. The current episode started in the past 7 days. The problem occurs intermittently. The problem has been unchanged. Associated symptoms include headaches, nausea and vertigo. Nothing aggravates the symptoms. He has tried rest for the symptoms. The treatment provided no relief.   Patient states that he has no other concerns at this time. Got drunk and dizzy, struck for awhile   Settled down after a few minutes  Last night struck pt  bp numbers generally good. Patient has history of known hypertension. Currently does not take medications for it. Watch his salt intake.  Has history of hyper lipidemia. Claims compliance with medication. No obvious side effects. Watching fats in the diet.   Watching sugars welll enough. History of glucose intolerance.  Exercising some Dizzy and spinning Got sweaty and clammy and nauseated   Review of Systems  Gastrointestinal: Positive for nausea.  Neurological: Positive for dizziness, vertigo and headaches.       Objective:   Physical Exam Alert no acute distress talkative. HEENT normal. Cerebellar function normal lungs clear. Heart regular rate and rhythm. Neuro exam intact. Ankles without edema.       Assessment & Plan:  Impression #1 vertigo discuss. In her ear dysfunction.  nature of the discussed. #2 hypertension good control off medications. #3 glucose intolerance status uncertain. #4 hyperlipidemia status uncertain. Plan Antivert when necessary for dizziness. Zofran when necessary for nausea. Exercise diet discussed. Appropriate blood work. Further recommendations based on results. WSL

## 2014-03-08 LAB — HEPATIC FUNCTION PANEL
ALBUMIN: 4.1 g/dL (ref 3.5–5.2)
ALT: 27 U/L (ref 0–53)
AST: 21 U/L (ref 0–37)
Alkaline Phosphatase: 74 U/L (ref 39–117)
BILIRUBIN TOTAL: 0.9 mg/dL (ref 0.2–1.2)
Bilirubin, Direct: 0.2 mg/dL (ref 0.0–0.3)
Indirect Bilirubin: 0.7 mg/dL (ref 0.2–1.2)
TOTAL PROTEIN: 6.5 g/dL (ref 6.0–8.3)

## 2014-03-08 LAB — LIPID PANEL
CHOL/HDL RATIO: 4.3 ratio
Cholesterol: 160 mg/dL (ref 0–200)
HDL: 37 mg/dL — AB (ref >39)
LDL Cholesterol: 78 mg/dL (ref 0–99)
TRIGLYCERIDES: 226 mg/dL — AB (ref <150)
VLDL: 45 mg/dL — ABNORMAL HIGH (ref 0–40)

## 2014-03-08 LAB — GLUCOSE, RANDOM: GLUCOSE: 117 mg/dL — AB (ref 70–99)

## 2014-03-26 ENCOUNTER — Other Ambulatory Visit: Payer: Self-pay | Admitting: Dermatology

## 2014-07-08 ENCOUNTER — Other Ambulatory Visit: Payer: Self-pay | Admitting: Family Medicine

## 2014-09-24 ENCOUNTER — Encounter: Payer: Self-pay | Admitting: *Deleted

## 2015-01-17 ENCOUNTER — Other Ambulatory Visit: Payer: Self-pay | Admitting: Family Medicine

## 2015-01-17 NOTE — Telephone Encounter (Signed)
Needs office visit.

## 2015-02-28 ENCOUNTER — Other Ambulatory Visit: Payer: Self-pay | Admitting: Family Medicine

## 2015-03-19 ENCOUNTER — Telehealth: Payer: Self-pay | Admitting: Family Medicine

## 2015-03-19 DIAGNOSIS — I10 Essential (primary) hypertension: Secondary | ICD-10-CM

## 2015-03-19 DIAGNOSIS — R7301 Impaired fasting glucose: Secondary | ICD-10-CM

## 2015-03-19 DIAGNOSIS — E785 Hyperlipidemia, unspecified: Secondary | ICD-10-CM

## 2015-03-19 DIAGNOSIS — Z125 Encounter for screening for malignant neoplasm of prostate: Secondary | ICD-10-CM

## 2015-03-19 MED ORDER — ESOMEPRAZOLE MAGNESIUM 40 MG PO CPDR: DELAYED_RELEASE_CAPSULE | ORAL | Status: DC

## 2015-03-19 MED ORDER — SIMVASTATIN 20 MG PO TABS: 20.0000 mg | ORAL_TABLET | Freq: Every day | ORAL | Status: DC

## 2015-03-19 NOTE — Telephone Encounter (Signed)
Lip liv m7 psa 

## 2015-03-19 NOTE — Telephone Encounter (Addendum)
Rxs sent electronically to pharmacy.  Blood work ordered in [employer]. Patient notified.

## 2015-03-19 NOTE — Telephone Encounter (Signed)
Pt need refill on esomeprazole (NEXIUM) 40 MG capsule and simvastatin (ZOCOR) 20 MG tablet, please send to [facility] Has appointment here 04/21/15 for a med check  Also needs BW ordered so that he may get done before his appointment   Please call pt when done [phone]

## 2015-04-14 LAB — BASIC METABOLIC PANEL
BUN / CREAT RATIO: 16 (ref 9–20)
BUN: 14 mg/dL (ref 6–24)
CO2: 26 mmol/L (ref 18–29)
CREATININE: 0.85 mg/dL (ref 0.76–1.27)
Calcium: 8.9 mg/dL (ref 8.7–10.2)
Chloride: 100 mmol/L (ref 97–108)
GFR calc Af Amer: 111 mL/min/{1.73_m2} (ref >59)
GFR calc non Af Amer: 96 mL/min/{1.73_m2} (ref >59)
GLUCOSE: 139 mg/dL — AB (ref 65–99)
POTASSIUM: 3.9 mmol/L (ref 3.5–5.2)
Sodium: 143 mmol/L (ref 134–144)

## 2015-04-14 LAB — HEPATIC FUNCTION PANEL
ALK PHOS: 80 IU/L (ref 39–117)
ALT: 24 IU/L (ref 0–44)
AST: 17 IU/L (ref 0–40)
Albumin: 4 g/dL (ref 3.5–5.5)
Bilirubin Total: 0.6 mg/dL (ref 0.0–1.2)
Bilirubin, Direct: 0.16 mg/dL (ref 0.00–0.40)
TOTAL PROTEIN: 6.5 g/dL (ref 6.0–8.5)

## 2015-04-14 LAB — LIPID PANEL
CHOLESTEROL TOTAL: 172 mg/dL (ref 100–199)
Chol/HDL Ratio: 4.8 ratio units (ref 0.0–5.0)
HDL: 36 mg/dL — AB (ref >39)
LDL Calculated: 95 mg/dL (ref 0–99)
Triglycerides: 204 mg/dL — ABNORMAL HIGH (ref 0–149)
VLDL Cholesterol Cal: 41 mg/dL — ABNORMAL HIGH (ref 5–40)

## 2015-04-14 LAB — PSA: Prostate Specific Ag, Serum: 1 ng/mL (ref 0.0–4.0)

## 2015-04-21 ENCOUNTER — Encounter: Payer: Self-pay | Admitting: Family Medicine

## 2015-04-21 ENCOUNTER — Ambulatory Visit (INDEPENDENT_AMBULATORY_CARE_PROVIDER_SITE_OTHER): Payer: Commercial Managed Care - HMO | Admitting: Family Medicine

## 2015-04-21 VITALS — BP 138/88 | Ht 74.0 in | Wt 260.0 lb

## 2015-04-21 DIAGNOSIS — E119 Type 2 diabetes mellitus without complications: Secondary | ICD-10-CM | POA: Diagnosis not present

## 2015-04-21 DIAGNOSIS — H8110 Benign paroxysmal vertigo, unspecified ear: Secondary | ICD-10-CM | POA: Diagnosis not present

## 2015-04-21 DIAGNOSIS — R739 Hyperglycemia, unspecified: Secondary | ICD-10-CM

## 2015-04-21 DIAGNOSIS — E785 Hyperlipidemia, unspecified: Secondary | ICD-10-CM

## 2015-04-21 LAB — POCT GLYCOSYLATED HEMOGLOBIN (HGB A1C): Hemoglobin A1C: 7.5

## 2015-04-21 MED ORDER — ESOMEPRAZOLE MAGNESIUM 40 MG PO CPDR: DELAYED_RELEASE_CAPSULE | ORAL | Status: DC

## 2015-04-21 MED ORDER — SIMVASTATIN 20 MG PO TABS: 20.0000 mg | ORAL_TABLET | Freq: Every day | ORAL | Status: DC

## 2015-04-21 NOTE — Patient Instructions (Signed)
Type 2 diabetes

## 2015-04-21 NOTE — Progress Notes (Signed)
   Subjective:    Patient ID: Cristian Nguyen, male    DOB: 06/19/1956, 59 y.o.   MRN: [id number]  Hyperlipidemia This is a chronic problem. The current episode started more than 1 year ago. There are no compliance problems (walks at work, plays golf, tries to eat healhty).    claims compliance with lipid medications. Watching diet. No obvious side effects from medicines medicines reviewed   Concerns about vertigo. Started 4 -5 years ago.had dizziness and sweatiness, lasted for a couple days and aggravating. Patient is been expansion vertigo off-and-on. Frustrated about it. Feels like he needs to see an ENT. Also and chest some potential Epley's maneuvers.  [phone]   Just took a half of a meclizine and got the dizziness  Taking meclizine.   Hyperglycemia. Glucose on bw 139. A1C today. 7.5. We been watching sugar for some time. Patient's trying to watch his diet. No obvious polyuria or polydipsia. Declines flu vaccine. Get at work.   Review of Systems No headache no chest pain no back pain abdominal pain no change in bowel habits no blood in stool    Objective:   Physical Exam  Alert vitals stable. HEENT normal. Lungs clear heart rare rhythm ankles without edema    Assessment & Plan:  Impression 1 recurrent vertigo reaching substantial point of challenge for patient discussed #2 hyperlipidemia overall good control discussed maintain same meds #3 type 2 diabetes new diagnosis several questions answered today but reserved the need for a further visit focusing completely non-diabetes plan ENT referral. Maintain same lipid medicine cut all his sugars out of diet recheck as scheduled diet exercise discussed WSL

## 2015-04-27 DIAGNOSIS — E119 Type 2 diabetes mellitus without complications: Secondary | ICD-10-CM | POA: Insufficient documentation

## 2015-05-06 ENCOUNTER — Encounter: Payer: Self-pay | Admitting: Family Medicine

## 2015-05-07 ENCOUNTER — Ambulatory Visit: Payer: Commercial Managed Care - HMO | Admitting: Family Medicine

## 2015-05-07 ENCOUNTER — Ambulatory Visit (INDEPENDENT_AMBULATORY_CARE_PROVIDER_SITE_OTHER): Payer: Commercial Managed Care - HMO | Admitting: Family Medicine

## 2015-05-07 ENCOUNTER — Encounter: Payer: Self-pay | Admitting: Family Medicine

## 2015-05-07 VITALS — BP 130/80 | Ht 74.0 in | Wt 257.5 lb

## 2015-05-07 DIAGNOSIS — E119 Type 2 diabetes mellitus without complications: Secondary | ICD-10-CM

## 2015-05-07 MED ORDER — FREESTYLE SYSTEM KIT: PACK | Status: DC

## 2015-05-07 NOTE — Progress Notes (Signed)
   Subjective:    Patient ID: Cristian Nguyen, male    DOB: 1956/01/05, 59 y.o.   MRN: [id number] Patient arrives office for new onset type 2 diabetes discussion Diabetes He presents for his follow-up diabetic visit. He has type 2 diabetes mellitus. There are no hypoglycemic associated symptoms. There are no diabetic associated symptoms. There are no hypoglycemic complications. There are no diabetic complications. There are no known risk factors for coronary artery disease. Current diabetic treatment includes diet. He is compliant with treatment all of the time.   Patient states that he has no new concerns at this time.  No sig fam hx of diabetes, mo has had borderine glucose, not had to take any med for it, Up until lately sugar in diet was reasoable  Has switched to diet drinks, and unsweet tea with art sweetener  Before dx, having two or thee soft drinks   Due to have flu shot in coming weeks  Exercising regully              Review of Systems No headache no chest pain no back pain no abdominal pain ROS otherwise negative    Objective:   Physical Exam  Alert vitals stable blood pressure good on repeat H&T normal lungs clear heart rare rhythm ankles without edema  Diabetic foot exam within normal limits    Assessment & Plan:  Impression type 2 diabetes extensive discussion held regarding underlying cause treatment long-term competitions long-term interventions potential for avoiding insulin etc. Etc. Plan to attend diabetes education class. Glucometer prescribed. Diet exercise discussed follow-up in several months check a couple fasting blood sugars per week no medications at this time easily 25 minutes spent most in discussion WSL

## 2015-05-07 NOTE — Patient Instructions (Signed)
With new diagnosis, yearly eye doc visits a good idea and important  Fasting number goal in morn is between 130 and 70  Diabetes education class is important, please attend

## 2015-08-07 ENCOUNTER — Encounter: Payer: Self-pay | Admitting: Family Medicine

## 2015-08-07 ENCOUNTER — Ambulatory Visit (INDEPENDENT_AMBULATORY_CARE_PROVIDER_SITE_OTHER): Payer: Commercial Managed Care - HMO | Admitting: Family Medicine

## 2015-08-07 VITALS — BP 130/84 | Ht 74.0 in | Wt 249.1 lb

## 2015-08-07 DIAGNOSIS — E119 Type 2 diabetes mellitus without complications: Secondary | ICD-10-CM | POA: Diagnosis not present

## 2015-08-07 DIAGNOSIS — E785 Hyperlipidemia, unspecified: Secondary | ICD-10-CM

## 2015-08-07 LAB — POCT GLYCOSYLATED HEMOGLOBIN (HGB A1C): Hemoglobin A1C: 6

## 2015-08-07 MED ORDER — FREESTYLE SYSTEM KIT: PACK | Status: DC

## 2015-08-07 MED ORDER — SIMVASTATIN 20 MG PO TABS: 20.0000 mg | ORAL_TABLET | Freq: Every day | ORAL | Status: DC

## 2015-08-07 MED ORDER — ESOMEPRAZOLE MAGNESIUM 40 MG PO CPDR: DELAYED_RELEASE_CAPSULE | ORAL | Status: DC

## 2015-08-07 NOTE — Progress Notes (Signed)
   Subjective:    Patient ID: Cristian Nguyen, male    DOB: April 08, 1956, 60 y.o.   MRN: [id number]  Diabetes He presents for his follow-up diabetic visit. He has type 2 diabetes mellitus. No MedicAlert identification noted. He has not had a previous visit with a dietitian. He does not see a podiatrist.Eye exam is not current.   Patient states no other concerns this visit.  Results for orders placed or performed in visit on 08/07/15  POCT HgB A1C  Result Value Ref Range   Hemoglobin A1C 6.0    Ten pound weight loss, overall doin g better, handling diet well  More atntn to foods and snacks  Most fasting sugars running generally good  Review of Systems No headache no chest pain no back pain no abdominal pain    Objective:   Physical Exam Alert vitals stable HEENT normal lungs clear heart regular in rhythm       Assessment & Plan:  Impression type 2 diabetes recent diagnosis control much improved with diet and exercise efforts alone plan maintain same questions answered follow-up in several months for follow-up of multiple health concerns WSL

## 2015-08-08 ENCOUNTER — Other Ambulatory Visit: Payer: Self-pay | Admitting: Family Medicine

## 2015-11-03 LAB — LIPID PANEL
CHOL/HDL RATIO: 3.9 ratio (ref 0.0–5.0)
Cholesterol, Total: 142 mg/dL (ref 100–199)
HDL: 36 mg/dL — ABNORMAL LOW (ref >39)
LDL Calculated: 77 mg/dL (ref 0–99)
TRIGLYCERIDES: 145 mg/dL (ref 0–149)
VLDL Cholesterol Cal: 29 mg/dL (ref 5–40)

## 2015-11-03 LAB — HEMOGLOBIN A1C
Est. average glucose Bld gHb Est-mCnc: 146 mg/dL
Hgb A1c MFr Bld: 6.7 % — ABNORMAL HIGH (ref 4.8–5.6)

## 2015-11-03 LAB — HEPATIC FUNCTION PANEL
ALK PHOS: 79 IU/L (ref 39–117)
ALT: 19 IU/L (ref 0–44)
AST: 16 IU/L (ref 0–40)
Albumin: 4.1 g/dL (ref 3.5–5.5)
BILIRUBIN TOTAL: 0.6 mg/dL (ref 0.0–1.2)
BILIRUBIN, DIRECT: 0.16 mg/dL (ref 0.00–0.40)
TOTAL PROTEIN: 6.4 g/dL (ref 6.0–8.5)

## 2015-11-05 ENCOUNTER — Ambulatory Visit: Payer: Commercial Managed Care - HMO | Admitting: Family Medicine

## 2015-11-06 ENCOUNTER — Encounter: Payer: Self-pay | Admitting: Family Medicine

## 2015-11-06 ENCOUNTER — Ambulatory Visit (INDEPENDENT_AMBULATORY_CARE_PROVIDER_SITE_OTHER): Payer: Commercial Managed Care - HMO | Admitting: Family Medicine

## 2015-11-06 VITALS — BP 132/82 | Ht 74.0 in | Wt 255.4 lb

## 2015-11-06 DIAGNOSIS — I10 Essential (primary) hypertension: Secondary | ICD-10-CM

## 2015-11-06 DIAGNOSIS — H8110 Benign paroxysmal vertigo, unspecified ear: Secondary | ICD-10-CM

## 2015-11-06 DIAGNOSIS — E785 Hyperlipidemia, unspecified: Secondary | ICD-10-CM | POA: Diagnosis not present

## 2015-11-06 DIAGNOSIS — E119 Type 2 diabetes mellitus without complications: Secondary | ICD-10-CM

## 2015-11-06 MED ORDER — SCOPOLAMINE 1 MG/3DAYS TD PT72: 1.0000 | MEDICATED_PATCH | TRANSDERMAL | Status: DC

## 2015-11-06 MED ORDER — SIMVASTATIN 20 MG PO TABS: 20.0000 mg | ORAL_TABLET | Freq: Every day | ORAL | Status: DC

## 2015-11-06 MED ORDER — ESOMEPRAZOLE MAGNESIUM 40 MG PO CPDR: DELAYED_RELEASE_CAPSULE | ORAL | Status: DC

## 2015-11-06 NOTE — Progress Notes (Signed)
   Subjective:    Patient ID: Cristian Nguyen, male    DOB: 09/24/1955, 60 y.o.   MRN: [id number] Patient arrives office for discussion of several issues. Diabetes He presents for his follow-up diabetic visit. He has type 2 diabetes mellitus. Risk factors for coronary artery disease include dyslipidemia. Current diabetic treatment includes diet. He is compliant with treatment all of the time. His weight is stable. He is following a diabetic diet. He has not had a previous visit with a dietitian. He does not see a podiatrist.Eye exam is not current.   Discuss recent labs including HgbA1c  Results for orders placed or performed in visit on 08/07/15  Lipid panel  Result Value Ref Range   Cholesterol, Total 142 100 - 199 mg/dL   Triglycerides 145 0 - 149 mg/dL   HDL 36 (L) >39 mg/dL   VLDL Cholesterol Cal 29 5 - 40 mg/dL   LDL Calculated 77 0 - 99 mg/dL   Chol/HDL Ratio 3.9 0.0 - 5.0 ratio units  Hepatic function panel  Result Value Ref Range   Total Protein 6.4 6.0 - 8.5 g/dL   Albumin 4.1 3.5 - 5.5 g/dL   Bilirubin Total 0.6 0.0 - 1.2 mg/dL   Bilirubin, Direct 0.16 0.00 - 0.40 mg/dL   Alkaline Phosphatase 79 39 - 117 IU/L   AST 16 0 - 40 IU/L   ALT 19 0 - 44 IU/L  Hemoglobin A1C  Result Value Ref Range   Hgb A1c MFr Bld 6.7 (H) 4.8 - 5.6 %   Est. average glucose Bld gHb Est-mCnc 146 mg/dL  POCT HgB A1C  Result Value Ref Range   Hemoglobin A1C 6.0    Getting out some, walking a fair amnt,,  Still sig ref, uses nex on a prn basis, states he uses it when he needs it and does not using morning does not  Patient claims compliance with lipid medication. No obvious side effects. Watching his diet. Has cut fats down in his diet.  Patient has history of chronic intermittent vertigo. Generally not a major problem. Concern about this in regards to taking a cruise soon. Wonders if he would be a candidate for medicine for prevention  Review of Systems No headache, no major weight loss or  weight gain, no chest pain no back pain abdominal pain no change in bowel habits complete ROS otherwise negative     Objective:   Physical Exam  Alert vitals stable. HEENT normal. Lungs clear. Heart regular in rhythm. Ankles without edema      Assessment & Plan:  Impression #1 type 2 diabetes good control discussed maintain same meds #2 hyperlipidemia good control prior blood work discussed maintain same meds #3 vertigo tendencies with coming cruise. Proper use of scopolamine patches discussed and prescribed plan as noted above diet exercise discussed recheck as scheduled WSL of note #4 hypertension good control currently with diet and exercise alone

## 2016-03-01 ENCOUNTER — Encounter: Payer: Self-pay | Admitting: Family Medicine

## 2016-03-01 ENCOUNTER — Ambulatory Visit (INDEPENDENT_AMBULATORY_CARE_PROVIDER_SITE_OTHER): Payer: Commercial Managed Care - HMO | Admitting: Family Medicine

## 2016-03-01 VITALS — BP 144/90 | Temp 98.4°F | Ht 74.5 in | Wt 258.0 lb

## 2016-03-01 DIAGNOSIS — J310 Chronic rhinitis: Secondary | ICD-10-CM

## 2016-03-01 DIAGNOSIS — J329 Chronic sinusitis, unspecified: Secondary | ICD-10-CM | POA: Diagnosis not present

## 2016-03-01 MED ORDER — FREESTYLE SYSTEM KIT: PACK | 99 refills | Status: DC

## 2016-03-01 MED ORDER — AMOXICILLIN-POT CLAVULANATE 875-125 MG PO TABS: 1.0000 | ORAL_TABLET | Freq: Two times a day (BID) | ORAL | 0 refills | Status: DC

## 2016-03-01 NOTE — Progress Notes (Signed)
   Subjective:    Patient ID: Cristian Nguyen, male    DOB: 05-20-1956, 60 y.o.   MRN: [id number]  Sinusitis  This is a new problem. Episode onset: 4 -5 days  Associated symptoms include congestion, coughing, headaches and a sore throat. Treatments tried: otc cold meds.    Pos prod cough  No sig fever   Greenish color  achey all over  Headache frontal in nature     Review of Systems  HENT: Positive for congestion and sore throat.   Respiratory: Positive for cough.   Neurological: Positive for headaches.       Objective:   Physical Exam  Alert, mild malaise. Hydration good Vitals stable. frontal/ maxillary tenderness evident positive nasal congestion. pharynx normal neck supple  lungs clear/no crackles or wheezes. heart regular in rhythm       Assessment & Plan:  Impression rhinosinusitis likely post viral, discussed with patient. plan antibiotics prescribed. Questions answered. Symptomatic care discussed. warning signs discussed. WSL

## 2016-04-29 ENCOUNTER — Telehealth: Payer: Self-pay | Admitting: Family Medicine

## 2016-04-29 DIAGNOSIS — I10 Essential (primary) hypertension: Secondary | ICD-10-CM

## 2016-04-29 DIAGNOSIS — E119 Type 2 diabetes mellitus without complications: Secondary | ICD-10-CM

## 2016-04-29 DIAGNOSIS — E785 Hyperlipidemia, unspecified: Secondary | ICD-10-CM

## 2016-04-29 DIAGNOSIS — Z125 Encounter for screening for malignant neoplasm of prostate: Secondary | ICD-10-CM

## 2016-04-29 NOTE — Telephone Encounter (Signed)
Patient has appointment on Monday for 6 month follow up and wandering if he needs lab work done.

## 2016-04-29 NOTE — Telephone Encounter (Signed)
Patient should do lipid, liver, metabolic 7, hemoglobin [id number], PSA, urine ACR-hyperlipidemia, diabetes. Please advise patient that a urine specimen as part of his blood work to check for protein in the urine

## 2016-04-29 NOTE — Telephone Encounter (Signed)
Blood work ordered in EPIC. Patient notified. 

## 2016-05-02 LAB — BASIC METABOLIC PANEL
BUN/Creatinine Ratio: 14 (ref 9–20)
BUN: 13 mg/dL (ref 6–24)
CALCIUM: 9 mg/dL (ref 8.7–10.2)
CO2: 24 mmol/L (ref 18–29)
CREATININE: 0.9 mg/dL (ref 0.76–1.27)
Chloride: 102 mmol/L (ref 96–106)
GFR calc Af Amer: 108 mL/min/{1.73_m2} (ref >59)
GFR calc non Af Amer: 93 mL/min/{1.73_m2} (ref >59)
GLUCOSE: 142 mg/dL — AB (ref 65–99)
Potassium: 4.4 mmol/L (ref 3.5–5.2)
SODIUM: 139 mmol/L (ref 134–144)

## 2016-05-02 LAB — HEPATIC FUNCTION PANEL
ALT: 20 IU/L (ref 0–44)
AST: 15 IU/L (ref 0–40)
Albumin: 3.9 g/dL (ref 3.5–5.5)
Alkaline Phosphatase: 87 IU/L (ref 39–117)
Bilirubin Total: 0.7 mg/dL (ref 0.0–1.2)
Bilirubin, Direct: 0.19 mg/dL (ref 0.00–0.40)
TOTAL PROTEIN: 6.4 g/dL (ref 6.0–8.5)

## 2016-05-02 LAB — LIPID PANEL
CHOL/HDL RATIO: 4.1 ratio (ref 0.0–5.0)
Cholesterol, Total: 150 mg/dL (ref 100–199)
HDL: 37 mg/dL — AB (ref >39)
LDL Calculated: 86 mg/dL (ref 0–99)
Triglycerides: 136 mg/dL (ref 0–149)
VLDL CHOLESTEROL CAL: 27 mg/dL (ref 5–40)

## 2016-05-02 LAB — MICROALBUMIN / CREATININE URINE RATIO
Creatinine, Urine: 205 mg/dL
MICROALB/CREAT RATIO: 6.2 mg/g{creat} (ref 0.0–30.0)
MICROALBUM., U, RANDOM: 12.7 ug/mL

## 2016-05-02 LAB — HEMOGLOBIN A1C
ESTIMATED AVERAGE GLUCOSE: 143 mg/dL
HEMOGLOBIN A1C: 6.6 % — AB (ref 4.8–5.6)

## 2016-05-02 LAB — PSA: Prostate Specific Ag, Serum: 1.4 ng/mL (ref 0.0–4.0)

## 2016-05-03 ENCOUNTER — Ambulatory Visit (INDEPENDENT_AMBULATORY_CARE_PROVIDER_SITE_OTHER): Payer: Commercial Managed Care - HMO | Admitting: Family Medicine

## 2016-05-03 ENCOUNTER — Encounter: Payer: Self-pay | Admitting: Family Medicine

## 2016-05-03 VITALS — BP 138/82 | Ht 75.0 in | Wt 256.0 lb

## 2016-05-03 DIAGNOSIS — K219 Gastro-esophageal reflux disease without esophagitis: Secondary | ICD-10-CM | POA: Diagnosis not present

## 2016-05-03 DIAGNOSIS — E119 Type 2 diabetes mellitus without complications: Secondary | ICD-10-CM

## 2016-05-03 DIAGNOSIS — I10 Essential (primary) hypertension: Secondary | ICD-10-CM | POA: Diagnosis not present

## 2016-05-03 MED ORDER — ESOMEPRAZOLE MAGNESIUM 40 MG PO CPDR: DELAYED_RELEASE_CAPSULE | ORAL | 5 refills | Status: DC

## 2016-05-03 MED ORDER — GLUCOSE BLOOD VI STRP: ORAL_STRIP | 5 refills | Status: DC

## 2016-05-03 MED ORDER — ETODOLAC 400 MG PO TABS: 400.0000 mg | ORAL_TABLET | Freq: Two times a day (BID) | ORAL | 0 refills | Status: DC

## 2016-05-03 MED ORDER — SIMVASTATIN 20 MG PO TABS: 20.0000 mg | ORAL_TABLET | Freq: Every day | ORAL | 5 refills | Status: DC

## 2016-05-03 NOTE — Progress Notes (Signed)
Subjective:    Patient ID: Cristian Nguyen, male    DOB: 11-Jan-1956, 60 y.o.   MRN: [id number]  Diabetes  He presents for his follow-up diabetic visit. He has type 2 diabetes mellitus. Current diabetic treatment includes diet. Exercise: could do better, active job, plays golf. Home blood sugar record trend: below 120.  A1C 6.6 on bloodwork.   Patient claims compliance with diabetes medication. No obvious side effects. Reports no substantial low sugar spells. Most numbers are generally in good range when checked fasting. Generally does not miss a dose of medication. Watching diabetic diet closely  Patient continues to take lipid medication regularly. No obvious side effects from it. Generally does not miss a dose. Prior blood work results are reviewed with patient. Patient continues to work on fat intake in diet  Results for orders placed or performed in visit on 04/29/16  Lipid panel  Result Value Ref Range   Cholesterol, Total 150 100 - 199 mg/dL   Triglycerides 136 0 - 149 mg/dL   HDL 37 (L) >39 mg/dL   VLDL Cholesterol Cal 27 5 - 40 mg/dL   LDL Calculated 86 0 - 99 mg/dL   Chol/HDL Ratio 4.1 0.0 - 5.0 ratio units  Hepatic function panel  Result Value Ref Range   Total Protein 6.4 6.0 - 8.5 g/dL   Albumin 3.9 3.5 - 5.5 g/dL   Bilirubin Total 0.7 0.0 - 1.2 mg/dL   Bilirubin, Direct 0.19 0.00 - 0.40 mg/dL   Alkaline Phosphatase 87 39 - 117 IU/L   AST 15 0 - 40 IU/L   ALT 20 0 - 44 IU/L  Basic metabolic panel  Result Value Ref Range   Glucose 142 (H) 65 - 99 mg/dL   BUN 13 6 - 24 mg/dL   Creatinine, Ser 0.90 0.76 - 1.27 mg/dL   GFR calc non Af Amer 93 >59 mL/min/1.73   GFR calc Af Amer 108 >59 mL/min/1.73   BUN/Creatinine Ratio 14 9 - 20   Sodium 139 134 - 144 mmol/L   Potassium 4.4 3.5 - 5.2 mmol/L   Chloride 102 96 - 106 mmol/L   CO2 24 18 - 29 mmol/L   Calcium 9.0 8.7 - 10.2 mg/dL  Hemoglobin A1c  Result Value Ref Range   Hgb A1c MFr Bld 6.6 (H) 4.8 - 5.6 %   Est.  average glucose Bld gHb Est-mCnc 143 mg/dL  Microalbumin / creatinine urine ratio  Result Value Ref Range   Creatinine, Urine 205.0 Not Estab. mg/dL   Microalbum.,U,Random 12.7 Not Estab. ug/mL   Microalb/Creat Ratio 6.2 0.0 - 30.0 mg/g creat  PSA  Result Value Ref Range   Prostate Specific Ag, Serum 1.4 0.0 - 4.0 ng/mL   Most morn numbers have been better Needs refills on nexium, gets reflux sif if skips days   Low bk pain achey and pulled a few wks ago, was tight, still irritated at times, took same ibuprofen ,,  Eye doc visit , due soon    withosimvastatin, and test strips.numbers good when he cks  Lot of walking at work       Pt states no concerns today.  Review of Systems No headache no chest pain no abdominal pain no change in bowel habits no rash ROS otherwise negative    Objective:   Physical Exam Alert vital stable blood pressure good on repeat HEENT normal lungs clear heart regular rhythm. Left leg negative straight leg raise no low back pain currently. Some  left lateral foot paresthesia to palpation. Reflexes intact       Assessment & Plan:  Impression #1 Left foot paresthesias with residual sciatic nerve irritation. Somewhat better. Patient wishes to try anti-inflammatory before further workup #2 type 2 diabetes A1c excellent continue same #3 history elevated blood pressure good on repeat #4 hyperlipidemia good control discussed medications refilled diet exercise discussed

## 2016-07-01 LAB — HM DIABETES EYE EXAM

## 2016-10-27 ENCOUNTER — Telehealth: Payer: Self-pay | Admitting: Family Medicine

## 2016-10-27 DIAGNOSIS — E119 Type 2 diabetes mellitus without complications: Secondary | ICD-10-CM

## 2016-10-27 DIAGNOSIS — Z79899 Other long term (current) drug therapy: Secondary | ICD-10-CM

## 2016-10-27 DIAGNOSIS — E785 Hyperlipidemia, unspecified: Secondary | ICD-10-CM

## 2016-10-27 NOTE — Telephone Encounter (Signed)
Lip liv A1c 

## 2016-10-27 NOTE — Telephone Encounter (Signed)
Left message on voicemail notifying patient bloodwork has been ordered.  

## 2016-10-27 NOTE — Telephone Encounter (Signed)
Requesting order for blood work for appointment on 11/01/16 with Dr. Richardson Landry.

## 2016-10-30 DIAGNOSIS — E785 Hyperlipidemia, unspecified: Secondary | ICD-10-CM | POA: Diagnosis not present

## 2016-10-30 DIAGNOSIS — Z79899 Other long term (current) drug therapy: Secondary | ICD-10-CM | POA: Diagnosis not present

## 2016-10-30 DIAGNOSIS — E119 Type 2 diabetes mellitus without complications: Secondary | ICD-10-CM | POA: Diagnosis not present

## 2016-10-31 LAB — HEMOGLOBIN A1C
ESTIMATED AVERAGE GLUCOSE: 160 mg/dL
HEMOGLOBIN A1C: 7.2 % — AB (ref 4.8–5.6)

## 2016-10-31 LAB — HEPATIC FUNCTION PANEL
ALT: 23 IU/L (ref 0–44)
AST: 17 IU/L (ref 0–40)
Albumin: 4.2 g/dL (ref 3.5–5.5)
Alkaline Phosphatase: 93 IU/L (ref 39–117)
Bilirubin Total: 0.4 mg/dL (ref 0.0–1.2)
Bilirubin, Direct: 0.11 mg/dL (ref 0.00–0.40)
Total Protein: 6.7 g/dL (ref 6.0–8.5)

## 2016-10-31 LAB — LIPID PANEL
CHOL/HDL RATIO: 4.4 ratio (ref 0.0–5.0)
Cholesterol, Total: 159 mg/dL (ref 100–199)
HDL: 36 mg/dL — AB (ref >39)
LDL CALC: 90 mg/dL (ref 0–99)
Triglycerides: 165 mg/dL — ABNORMAL HIGH (ref 0–149)
VLDL CHOLESTEROL CAL: 33 mg/dL (ref 5–40)

## 2016-11-01 ENCOUNTER — Encounter: Payer: Self-pay | Admitting: Family Medicine

## 2016-11-01 ENCOUNTER — Ambulatory Visit (INDEPENDENT_AMBULATORY_CARE_PROVIDER_SITE_OTHER): Payer: Commercial Managed Care - HMO | Admitting: Family Medicine

## 2016-11-01 VITALS — BP 130/86 | Ht 75.0 in | Wt 259.0 lb

## 2016-11-01 DIAGNOSIS — E119 Type 2 diabetes mellitus without complications: Secondary | ICD-10-CM | POA: Diagnosis not present

## 2016-11-01 DIAGNOSIS — K219 Gastro-esophageal reflux disease without esophagitis: Secondary | ICD-10-CM | POA: Diagnosis not present

## 2016-11-01 DIAGNOSIS — I10 Essential (primary) hypertension: Secondary | ICD-10-CM | POA: Diagnosis not present

## 2016-11-01 DIAGNOSIS — E7800 Pure hypercholesterolemia, unspecified: Secondary | ICD-10-CM | POA: Diagnosis not present

## 2016-11-01 MED ORDER — SIMVASTATIN 20 MG PO TABS: 20.0000 mg | ORAL_TABLET | Freq: Every day | ORAL | 5 refills | Status: DC

## 2016-11-01 MED ORDER — ESOMEPRAZOLE MAGNESIUM 40 MG PO CPDR: DELAYED_RELEASE_CAPSULE | ORAL | 5 refills | Status: DC

## 2016-11-01 NOTE — Progress Notes (Signed)
   Subjective:    Patient ID: Cristian Nguyen, male    DOB: 1956/02/12, 61 y.o.   MRN: [id number]  Diabetes  He presents for his follow-up diabetic visit. He has type 2 diabetes mellitus. Risk factors for coronary artery disease include dyslipidemia and hypertension. Current diabetic treatment includes diet. He is compliant with treatment all of the time. His weight is stable. He is following a diabetic diet.  Patient with problems with allergies and sinuses since mowing over weekend Results for orders placed or performed in visit on 10/27/16  Lipid panel  Result Value Ref Range   Cholesterol, Total 159 100 - 199 mg/dL   Triglycerides 165 (H) 0 - 149 mg/dL   HDL 36 (L) >39 mg/dL   VLDL Cholesterol Cal 33 5 - 40 mg/dL   LDL Calculated 90 0 - 99 mg/dL   Chol/HDL Ratio 4.4 0.0 - 5.0 ratio  Hepatic function panel  Result Value Ref Range   Total Protein 6.7 6.0 - 8.5 g/dL   Albumin 4.2 3.5 - 5.5 g/dL   Bilirubin Total 0.4 0.0 - 1.2 mg/dL   Bilirubin, Direct 0.11 0.00 - 0.40 mg/dL   Alkaline Phosphatase 93 39 - 117 IU/L   AST 17 0 - 40 IU/L   ALT 23 0 - 44 IU/L  Hemoglobin A1c  Result Value Ref Range   Hgb A1c MFr Bld 7.2 (H) 4.8 - 5.6 %   Est. average glucose Bld gHb Est-mCnc 160 mg/dL   Little throat tickel and no prod uctive cough   Patient continues to take lipid medication regularly. No obvious side effects from it. Generally does not miss a dose. Prior blood work results are reviewed with patient. Patient continues to work on fat intake in diet  Patient claims compliance with diabetes medication. No obvious side effects. Reports no substantial low sugar spells. Most numbers are generally in good range when checked fasting. Generally does not miss a dose of medication. Watching diabetic diet closely  Blood pressure medicine and blood pressure levels reviewed today with patient. Compliant with blood pressure medicine. States does not miss a dose. No obvious side effects. Blood  pressure generally good when checked elsewhere. Watching salt intake.    Review of Systems No headache, no major weight loss or weight gain, no chest pain no back pain abdominal pain no change in bowel habits complete ROS otherwise negative     Objective:   Physical Exam  Alert and oriented, vitals reviewed and stable, NAD ENT-TM's and ext canals WNL bilat via otoscopic exam Soft palate, tonsils and post pharynx WNL via oropharyngeal exam Neck-symmetric, no masses; thyroid nonpalpable and nontender Pulmonary-no tachypnea or accessory muscle use; Clear without wheezes via auscultation Card--no abnrml murmurs, rhythm reg and rate WNL Carotid pulses symmetric, without bruits       Assessment & Plan:  Impression 1 type 2 diabetes control good though not perfect discuss patient to work harder on meds #2 hyperlipidemia prior blood work reviewed discussed maintain same #3 hypertension blood pressure currently stable without medication discussed #4 allergic rhinitis question element of early sinusitis it persists we'll press on cover with antibiotic discussed diet exercise discussed medications refilled blood work reviewed

## 2017-05-02 ENCOUNTER — Telehealth: Payer: Self-pay | Admitting: Family Medicine

## 2017-05-02 DIAGNOSIS — Z79899 Other long term (current) drug therapy: Secondary | ICD-10-CM

## 2017-05-02 DIAGNOSIS — E785 Hyperlipidemia, unspecified: Secondary | ICD-10-CM

## 2017-05-02 DIAGNOSIS — Z125 Encounter for screening for malignant neoplasm of prostate: Secondary | ICD-10-CM

## 2017-05-02 NOTE — Telephone Encounter (Signed)
Patient is requesting orders for labs.  He has an appointment with Dr. Richardson Landry tomorrow.  He was hoping to have this done this morning if any way possible.

## 2017-05-02 NOTE — Telephone Encounter (Signed)
Lip liv m7 psa, since just day before and often labs not back will do A1c when he comes in

## 2017-05-02 NOTE — Telephone Encounter (Signed)
Orders put in. Pt notified.  

## 2017-05-02 NOTE — Telephone Encounter (Signed)
Last labs 10/30/16 lipid, liver, a1c

## 2017-05-03 ENCOUNTER — Ambulatory Visit (INDEPENDENT_AMBULATORY_CARE_PROVIDER_SITE_OTHER): Payer: 59 | Admitting: Family Medicine

## 2017-05-03 ENCOUNTER — Encounter: Payer: Self-pay | Admitting: Family Medicine

## 2017-05-03 VITALS — BP 160/104 | Ht 75.0 in | Wt 258.0 lb

## 2017-05-03 DIAGNOSIS — E119 Type 2 diabetes mellitus without complications: Secondary | ICD-10-CM | POA: Diagnosis not present

## 2017-05-03 DIAGNOSIS — I10 Essential (primary) hypertension: Secondary | ICD-10-CM | POA: Diagnosis not present

## 2017-05-03 DIAGNOSIS — E785 Hyperlipidemia, unspecified: Secondary | ICD-10-CM | POA: Diagnosis not present

## 2017-05-03 LAB — POCT GLYCOSYLATED HEMOGLOBIN (HGB A1C): Hemoglobin A1C: 6.7

## 2017-05-03 MED ORDER — SIMVASTATIN 20 MG PO TABS: 20.0000 mg | ORAL_TABLET | Freq: Every day | ORAL | 5 refills | Status: DC

## 2017-05-03 MED ORDER — ESOMEPRAZOLE MAGNESIUM 40 MG PO CPDR: DELAYED_RELEASE_CAPSULE | ORAL | 5 refills | Status: DC

## 2017-05-03 MED ORDER — GLUCOSE BLOOD VI STRP: ORAL_STRIP | 5 refills | Status: DC

## 2017-05-03 NOTE — Progress Notes (Signed)
   Subjective:    Patient ID: Cristian Nguyen, male    DOB: 1956-04-26, 61 y.o.   MRN: [id number]  Diabetes    Patient is here today for follow up on Dm. He states he is not on any medications for the DM diet controlled. HE eats healthy, and does get some exercise. Pt due for PHQ 9. Pt declines flu shot states he will get it at work. Declined in epic.  Patient claims compliance with diabetes medication. No obvious side effects. Reports no substantial low sugar spells. Most numbers are generally in good range when checked fasting. Generally does not miss a dose of medication. Watching diabetic diet closely  Morn numbrs usually every other day, mostly 100 to 120 eighty per ent of the time  Overall numbers better incr salt intake  Some anxieties with work latly       Review of Systems No headache, no major weight loss or weight gain, no chest pain no back pain abdominal pain no change in bowel habits complete ROS otherwise negative     Results for orders placed or performed in visit on 05/03/17  POCT glycosylated hemoglobin (Hb A1C)  Result Value Ref Range   Hemoglobin A1C 6.7     Objective:   Physical Exam  Alert and oriented, vitals reviewed and stable, NAD ENT-TM's and ext canals WNL bilat via otoscopic exam Soft palate, tonsils and post pharynx WNL via oropharyngeal exam Neck-symmetric, no masses; thyroid nonpalpable and nontender Pulmonary-no tachypnea or accessory muscle use; Clear without wheezes via auscultation Card--no abnrml murmurs, rhythm reg and rate WNL Carotid pulses symmetric, without bruits       Assessment & Plan:  Impression 1 type 2 diabetes A1c improved.  Holding off of medications.  Diet exercise discussed.  2.  Hypertension good control discussed mild elevation no need for medications yet.  3.  Hyperlipidemia prior blood work discussed to maintain same  Patient to get flu shot at work.  Diet exercise discussed.  Blood work discussed/follow-up  for wellness plus chronic

## 2017-05-07 DIAGNOSIS — Z79899 Other long term (current) drug therapy: Secondary | ICD-10-CM | POA: Diagnosis not present

## 2017-05-07 DIAGNOSIS — Z125 Encounter for screening for malignant neoplasm of prostate: Secondary | ICD-10-CM | POA: Diagnosis not present

## 2017-05-07 DIAGNOSIS — E785 Hyperlipidemia, unspecified: Secondary | ICD-10-CM | POA: Diagnosis not present

## 2017-05-08 ENCOUNTER — Encounter: Payer: Self-pay | Admitting: Family Medicine

## 2017-05-08 LAB — HEPATIC FUNCTION PANEL
ALBUMIN: 4.1 g/dL (ref 3.6–4.8)
ALT: 25 IU/L (ref 0–44)
AST: 22 IU/L (ref 0–40)
Alkaline Phosphatase: 99 IU/L (ref 39–117)
BILIRUBIN TOTAL: 0.8 mg/dL (ref 0.0–1.2)
BILIRUBIN, DIRECT: 0.21 mg/dL (ref 0.00–0.40)
Total Protein: 6.5 g/dL (ref 6.0–8.5)

## 2017-05-08 LAB — LIPID PANEL
Chol/HDL Ratio: 3.9 ratio (ref 0.0–5.0)
Cholesterol, Total: 134 mg/dL (ref 100–199)
HDL: 34 mg/dL — AB (ref >39)
LDL Calculated: 68 mg/dL (ref 0–99)
TRIGLYCERIDES: 158 mg/dL — AB (ref 0–149)
VLDL CHOLESTEROL CAL: 32 mg/dL (ref 5–40)

## 2017-05-08 LAB — BASIC METABOLIC PANEL
BUN / CREAT RATIO: 15 (ref 10–24)
BUN: 14 mg/dL (ref 8–27)
CHLORIDE: 103 mmol/L (ref 96–106)
CO2: 25 mmol/L (ref 20–29)
Calcium: 9.2 mg/dL (ref 8.6–10.2)
Creatinine, Ser: 0.94 mg/dL (ref 0.76–1.27)
GFR, EST AFRICAN AMERICAN: 101 mL/min/{1.73_m2} (ref >59)
GFR, EST NON AFRICAN AMERICAN: 88 mL/min/{1.73_m2} (ref >59)
Glucose: 163 mg/dL — ABNORMAL HIGH (ref 65–99)
POTASSIUM: 4.4 mmol/L (ref 3.5–5.2)
Sodium: 142 mmol/L (ref 134–144)

## 2017-05-08 LAB — PSA: Prostate Specific Ag, Serum: 1.7 ng/mL (ref 0.0–4.0)

## 2017-10-24 ENCOUNTER — Telehealth: Payer: Self-pay | Admitting: Family Medicine

## 2017-10-24 ENCOUNTER — Other Ambulatory Visit: Payer: Self-pay | Admitting: *Deleted

## 2017-10-24 DIAGNOSIS — E119 Type 2 diabetes mellitus without complications: Secondary | ICD-10-CM

## 2017-10-24 DIAGNOSIS — E785 Hyperlipidemia, unspecified: Secondary | ICD-10-CM

## 2017-10-24 DIAGNOSIS — Z0000 Encounter for general adult medical examination without abnormal findings: Secondary | ICD-10-CM

## 2017-10-24 NOTE — Telephone Encounter (Signed)
Orders ready. Pt notified.  

## 2017-10-24 NOTE — Telephone Encounter (Signed)
Please advise 

## 2017-10-24 NOTE — Telephone Encounter (Signed)
Patient has an appointment on 10/31/17 with Dr. Richardson Landry.  He is requesting orders for labs.

## 2017-10-24 NOTE — Telephone Encounter (Signed)
Lip liv A1c 

## 2017-10-24 NOTE — Telephone Encounter (Signed)
Had labs drawn last on 05/07/2017 Psa,Bmet,hepatic,Lipid,A1c.Please advise.

## 2017-10-28 DIAGNOSIS — E785 Hyperlipidemia, unspecified: Secondary | ICD-10-CM | POA: Diagnosis not present

## 2017-10-28 DIAGNOSIS — Z0000 Encounter for general adult medical examination without abnormal findings: Secondary | ICD-10-CM | POA: Diagnosis not present

## 2017-10-28 DIAGNOSIS — E119 Type 2 diabetes mellitus without complications: Secondary | ICD-10-CM | POA: Diagnosis not present

## 2017-10-29 LAB — LIPID PANEL
CHOL/HDL RATIO: 4.1 ratio (ref 0.0–5.0)
Cholesterol, Total: 142 mg/dL (ref 100–199)
HDL: 35 mg/dL — AB (ref >39)
LDL CALC: 67 mg/dL (ref 0–99)
Triglycerides: 201 mg/dL — ABNORMAL HIGH (ref 0–149)
VLDL CHOLESTEROL CAL: 40 mg/dL (ref 5–40)

## 2017-10-29 LAB — HEMOGLOBIN A1C
ESTIMATED AVERAGE GLUCOSE: 197 mg/dL
HEMOGLOBIN A1C: 8.5 % — AB (ref 4.8–5.6)

## 2017-10-29 LAB — HEPATIC FUNCTION PANEL
ALBUMIN: 3.9 g/dL (ref 3.6–4.8)
ALT: 24 IU/L (ref 0–44)
AST: 20 IU/L (ref 0–40)
Alkaline Phosphatase: 111 IU/L (ref 39–117)
BILIRUBIN TOTAL: 0.8 mg/dL (ref 0.0–1.2)
Bilirubin, Direct: 0.2 mg/dL (ref 0.00–0.40)
Total Protein: 6.3 g/dL (ref 6.0–8.5)

## 2017-10-31 ENCOUNTER — Ambulatory Visit: Payer: 59 | Admitting: Family Medicine

## 2017-10-31 ENCOUNTER — Encounter: Payer: Self-pay | Admitting: Family Medicine

## 2017-10-31 VITALS — BP 152/98 | Ht 73.0 in | Wt 258.0 lb

## 2017-10-31 DIAGNOSIS — E119 Type 2 diabetes mellitus without complications: Secondary | ICD-10-CM

## 2017-10-31 DIAGNOSIS — Z1211 Encounter for screening for malignant neoplasm of colon: Secondary | ICD-10-CM | POA: Diagnosis not present

## 2017-10-31 DIAGNOSIS — Z0000 Encounter for general adult medical examination without abnormal findings: Secondary | ICD-10-CM

## 2017-10-31 DIAGNOSIS — I10 Essential (primary) hypertension: Secondary | ICD-10-CM

## 2017-10-31 DIAGNOSIS — E785 Hyperlipidemia, unspecified: Secondary | ICD-10-CM | POA: Diagnosis not present

## 2017-10-31 MED ORDER — ZOSTER VAC RECOMB ADJUVANTED 50 MCG/0.5ML IM SUSR: 0.5000 mL | Freq: Once | INTRAMUSCULAR | 1 refills | Status: AC

## 2017-10-31 MED ORDER — SIMVASTATIN 20 MG PO TABS: 20.0000 mg | ORAL_TABLET | Freq: Every day | ORAL | 5 refills | Status: DC

## 2017-10-31 MED ORDER — ESOMEPRAZOLE MAGNESIUM 40 MG PO CPDR: DELAYED_RELEASE_CAPSULE | ORAL | 5 refills | Status: DC

## 2017-10-31 MED ORDER — METFORMIN HCL 500 MG PO TABS: ORAL_TABLET | ORAL | 5 refills | Status: DC

## 2017-10-31 NOTE — Progress Notes (Signed)
Subjective:    Patient ID: Cristian Nguyen, male    DOB: Sep 30, 1955, 62 y.o.   MRN: [id number]  HPI The patient comes in today for a wellness visit.    A review of their health history was completed.  A review of medications was also completed.  Any needed refills; nexium and simvastatin.   Eating habits: health conscious  Falls/  MVA accidents in past few months: none  Regular exercise: plays golf, gardening, yard work  Sales promotion account executive pt sees on regular basis: none  Preventative health issues were discussed.   Additional concerns: colonscopy due. Wants referral to Dr. Arnoldo Morale for colonscopy.   Patient claims compliance with diabetes medication. No obvious side effects. Reports no substantial low sugar spells. Most numbers are generally in good range when checked fasting. Generally does not miss a dose of medication. Watching diabetic diet closely  Results for orders placed or performed in visit on 10/24/17  Hemoglobin A1c  Result Value Ref Range   Hgb A1c MFr Bld 8.5 (H) 4.8 - 5.6 %   Est. average glucose Bld gHb Est-mCnc 197 mg/dL  Hepatic function panel  Result Value Ref Range   Total Protein 6.3 6.0 - 8.5 g/dL   Albumin 3.9 3.6 - 4.8 g/dL   Bilirubin Total 0.8 0.0 - 1.2 mg/dL   Bilirubin, Direct 0.20 0.00 - 0.40 mg/dL   Alkaline Phosphatase 111 39 - 117 IU/L   AST 20 0 - 40 IU/L   ALT 24 0 - 44 IU/L  Lipid panel  Result Value Ref Range   Cholesterol, Total 142 100 - 199 mg/dL   Triglycerides 201 (H) 0 - 149 mg/dL   HDL 35 (L) >39 mg/dL   VLDL Cholesterol Cal 40 5 - 40 mg/dL   LDL Calculated 67 0 - 99 mg/dL   Chol/HDL Ratio 4.1 0.0 - 5.0 ratio   Patient continues to take lipid medication regularly. No obvious side effects from it. Generally does not miss a dose. Prior blood work results are reviewed with patient. 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  HENT: Negative for congestion and rhinorrhea.   Eyes: Negative for discharge.  Respiratory: Negative for cough and wheezing.   Cardiovascular: Negative for chest pain.  Gastrointestinal: Negative for abdominal pain, blood in stool and vomiting.  Genitourinary: Negative for difficulty urinating and frequency.  Musculoskeletal: Negative for neck pain.  Skin: Negative for rash.  Allergic/Immunologic: Negative for environmental allergies and food allergies.  Neurological: Negative for weakness and headaches.  Psychiatric/Behavioral: Negative for agitation.  All other systems reviewed and are negative.      Objective:      Alert active good hydration.  HEENT normal.  Pharynx normal neck supple no lymphadenopathy.  Lungs clear.  Heart regular rate and rhythm.  Abdominal exam benign.  Prostate exam within normal limits testicular exam normal  Extremities within normal limits excellent pulses see diabetic foot exam.      Assessment & Plan:  1 impression wellness exam.  Diet discussed.  Exercise discussed.  Do colonoscopy.  Would like to see Dr. Arnoldo Morale once again this will be set up  2.  Hyperlipidemia decent control discussed maintain same  meds.  Blood work discussed  3.  Type 2 diabetes.  Suboptimal control discussed time to initiate medications.  Shingrix vaccine recommended.  Prescription given.  Medications refilled.  Diet exercise discussed follow-up in 6 months  Of note blood pressure remains elevated encouraged to get his own machine to check at home.  Bring #at next visit

## 2017-10-31 NOTE — Patient Instructions (Addendum)
Omron 5 BP automated cuff  Go ahead and purchase and ck one bp per week, different times per ady   perfect goal top 130 or less  Bottom number   80 or less

## 2017-11-09 ENCOUNTER — Encounter: Payer: Self-pay | Admitting: Family Medicine

## 2017-12-01 ENCOUNTER — Ambulatory Visit: Payer: 59 | Admitting: General Surgery

## 2017-12-13 ENCOUNTER — Encounter: Payer: Self-pay | Admitting: General Surgery

## 2017-12-13 ENCOUNTER — Ambulatory Visit: Payer: 59 | Admitting: General Surgery

## 2017-12-13 VITALS — BP 169/98 | HR 115 | Temp 98.9°F | Resp 20 | Wt 250.0 lb

## 2017-12-13 DIAGNOSIS — Z86010 Personal history of colonic polyps: Secondary | ICD-10-CM

## 2017-12-13 MED ORDER — PEG 3350-KCL-NABCB-NACL-NASULF 236 G PO SOLR: 4000.0000 mL | Freq: Once | ORAL | 0 refills | Status: AC

## 2017-12-13 NOTE — Progress Notes (Addendum)
Cristian Nguyen; [id number]; 1956/01/27   HPI Patient is a 62 year old white male who was referred to my care by Dr. Mickie Hillier for follow-up colonoscopy.  He last had a colonoscopy in 2013 and several polyps were removed.  He does have a family history of colon polyps.  He denies any recent weight loss, blood per rectum, abnormal abdominal pain, diarrhea, constipation.  He currently has no abdominal pain. Past Medical History:  Diagnosis Date  . GERD (gastroesophageal reflux disease)   . Hyperlipidemia   . Hypertension   . Impaired fasting glucose   . Reactive airway disease     Past Surgical History:  Procedure Laterality Date  . CHOLECYSTECTOMY    . COLONOSCOPY  02/29/2012   Procedure: COLONOSCOPY;  Surgeon: Jamesetta So, MD;  Location: [facility];  Service: Gastroenterology;  Laterality: N/A;  . KNEE ARTHROSCOPY  2010    Family History  Problem Relation Age of Onset  . Hypertension Mother   . Heart disease Father   . Cancer Father        bladder  . Cancer Brother        throat    Current Outpatient Medications on File Prior to Visit  Medication Sig Dispense Refill  . esomeprazole (NEXIUM) 40 MG capsule TAKE ONE CAPSULE BY MOUTH ONCE DAILY BEFORE BREAKFAST 30 capsule 5  . glucose blood (FREESTYLE LITE) test strip USE ONE STRIP TO CHECK GLUCOSE ONCE DAILY DX. E11.9 50 each 5  . glucose monitoring kit (FREESTYLE) monitoring kit Test once daily.  Diabetes type 2. E11.9 1 each PRN  . metFORMIN (GLUCOPHAGE) 500 MG tablet Take two bid 60 tablet 5  . simvastatin (ZOCOR) 20 MG tablet Take 1 tablet (20 mg total) by mouth daily with breakfast. 30 tablet 5   No current facility-administered medications on file prior to visit.     No Known Allergies  Social History   Substance and Sexual Activity  Alcohol Use No    Social History   Tobacco Use  Smoking Status Former Smoker  . Years: 35.00  . Types: Cigarettes  . Last attempt to quit: 12/09/2006  . Years since  quitting: 11.0  Smokeless Tobacco Never Used    Review of Systems  Constitutional: Negative.   HENT: Negative.   Eyes: Negative.   Respiratory: Negative.   Cardiovascular: Negative.   Gastrointestinal: Negative.   Genitourinary: Negative.   Musculoskeletal: Negative.   Skin: Negative.   Neurological: Negative.   Endo/Heme/Allergies: Negative.   Psychiatric/Behavioral: Negative.     Objective   Vitals:   12/13/17 1355  BP: (!) 169/98  Pulse: (!) 115  Resp: 20  Temp: 98.9 F (37.2 C)    Physical Exam  Constitutional: He is oriented to person, place, and time. He appears well-developed and well-nourished.  HENT:  Head: Normocephalic and atraumatic.  Cardiovascular: Normal rate, regular rhythm and normal heart sounds. Exam reveals no gallop and no friction rub.  No murmur heard. Pulmonary/Chest: Effort normal and breath sounds normal. No stridor. No respiratory distress. He has no wheezes. He has no rales.  Abdominal: Soft. Bowel sounds are normal. He exhibits no distension and no mass. There is no tenderness. There is no rebound and no guarding.  Neurological: He is alert and oriented to person, place, and time.  Skin: Skin is warm and dry.  Vitals reviewed.  Previous colonoscopy and pathology report reviewed Assessment  History of colon polyps, family history of colon polyps. Plan   Patient is  scheduled for a colonoscopy on 01/03/2018.  The risks and benefits of the procedure including bleeding and perforation were fully explained to the patient, who gave informed consent.  GoLYTELY has been prescribed.

## 2017-12-20 DIAGNOSIS — E11319 Type 2 diabetes mellitus with unspecified diabetic retinopathy without macular edema: Secondary | ICD-10-CM | POA: Diagnosis not present

## 2018-01-03 ENCOUNTER — Other Ambulatory Visit: Payer: Self-pay

## 2018-01-03 ENCOUNTER — Encounter (HOSPITAL_COMMUNITY)
Admission: RE | Disposition: A | Discharge status: Home / Self Care | Payer: Self-pay | Source: Ambulatory Visit | Attending: General Surgery

## 2018-01-03 ENCOUNTER — Encounter (HOSPITAL_COMMUNITY): Payer: Self-pay | Admitting: *Deleted

## 2018-01-03 ENCOUNTER — Ambulatory Visit (HOSPITAL_COMMUNITY)
Admission: RE | Admit: 2018-01-03 | Discharge: 2018-01-03 | Disposition: A | Discharge status: Home / Self Care | Payer: 59 | Source: Ambulatory Visit | Attending: General Surgery | Admitting: General Surgery

## 2018-01-03 DIAGNOSIS — E119 Type 2 diabetes mellitus without complications: Secondary | ICD-10-CM | POA: Diagnosis not present

## 2018-01-03 DIAGNOSIS — I10 Essential (primary) hypertension: Secondary | ICD-10-CM | POA: Diagnosis not present

## 2018-01-03 DIAGNOSIS — Z8371 Family history of colonic polyps: Secondary | ICD-10-CM | POA: Insufficient documentation

## 2018-01-03 DIAGNOSIS — E785 Hyperlipidemia, unspecified: Secondary | ICD-10-CM | POA: Insufficient documentation

## 2018-01-03 DIAGNOSIS — Z860100 Personal history of colon polyps, unspecified: Secondary | ICD-10-CM

## 2018-01-03 DIAGNOSIS — K219 Gastro-esophageal reflux disease without esophagitis: Secondary | ICD-10-CM | POA: Diagnosis not present

## 2018-01-03 DIAGNOSIS — Z87891 Personal history of nicotine dependence: Secondary | ICD-10-CM | POA: Diagnosis not present

## 2018-01-03 DIAGNOSIS — Z8249 Family history of ischemic heart disease and other diseases of the circulatory system: Secondary | ICD-10-CM | POA: Diagnosis not present

## 2018-01-03 DIAGNOSIS — Z79899 Other long term (current) drug therapy: Secondary | ICD-10-CM | POA: Insufficient documentation

## 2018-01-03 DIAGNOSIS — J45909 Unspecified asthma, uncomplicated: Secondary | ICD-10-CM | POA: Diagnosis not present

## 2018-01-03 DIAGNOSIS — Z1211 Encounter for screening for malignant neoplasm of colon: Secondary | ICD-10-CM | POA: Diagnosis not present

## 2018-01-03 DIAGNOSIS — K635 Polyp of colon: Secondary | ICD-10-CM | POA: Insufficient documentation

## 2018-01-03 DIAGNOSIS — Z7984 Long term (current) use of oral hypoglycemic drugs: Secondary | ICD-10-CM | POA: Insufficient documentation

## 2018-01-03 DIAGNOSIS — K621 Rectal polyp: Secondary | ICD-10-CM

## 2018-01-03 HISTORY — PX: COLONOSCOPY: SHX5424

## 2018-01-03 LAB — GLUCOSE, CAPILLARY: GLUCOSE-CAPILLARY: 114 mg/dL — AB (ref 70–99)

## 2018-01-03 SURGERY — COLONOSCOPY
Anesthesia: Moderate Sedation

## 2018-01-03 MED ORDER — SODIUM CHLORIDE 0.9 % IV SOLN
INTRAVENOUS | Status: DC
  Administered 2018-01-03: 07:00:00 via INTRAVENOUS

## 2018-01-03 MED ORDER — SODIUM CHLORIDE 0.9 % IV SOLN: INTRAVENOUS | Status: DC

## 2018-01-03 MED ORDER — MEPERIDINE HCL 100 MG/ML IJ SOLN
INTRAMUSCULAR | Status: AC
  Filled 2018-01-03: qty 1

## 2018-01-03 MED ORDER — MIDAZOLAM HCL 5 MG/5ML IJ SOLN
INTRAMUSCULAR | Status: AC
  Filled 2018-01-03: qty 5

## 2018-01-03 MED ORDER — MEPERIDINE HCL 50 MG/ML IJ SOLN
INTRAMUSCULAR | Status: DC | PRN
  Administered 2018-01-03: 50 mg via INTRAVENOUS

## 2018-01-03 MED ORDER — MIDAZOLAM HCL 5 MG/5ML IJ SOLN
INTRAMUSCULAR | Status: DC | PRN
  Administered 2018-01-03: 1 mg via INTRAVENOUS
  Administered 2018-01-03: 3 mg via INTRAVENOUS
  Administered 2018-01-03: 1 mg via INTRAVENOUS

## 2018-01-03 NOTE — Op Note (Signed)
[facility] Patient Name: Cristian Nguyen Procedure Date: 01/03/2018 7:07 AM MRN: [id number] Date of Birth: August 23, 1955 Attending MD: Aviva Signs , MD CSN: [id number] Age: 62 Admit Type: Outpatient Procedure:                Colonoscopy Indications:              Personal history of colonic polyps Providers:                Aviva Signs, MD, Otis Peak B. Gwenlyn Perking RN, RN, Aram Candela Referring MD:              Medicines:                Meperidine 50 mg IV, Midazolam 5 mg IV Complications:            No immediate complications. Estimated blood loss:                            None. Estimated Blood Loss:     Estimated blood loss: none. Procedure:                Pre-Anesthesia Assessment:                           - Prior to the procedure, a History and Physical                            was performed, and patient medications and                            allergies were reviewed. The patient is competent.                            The risks and benefits of the procedure and the                            sedation options and risks were discussed with the                            patient. All questions were answered and informed                            consent was obtained. Patient identification and                            proposed procedure were verified by the physician,                            the nurse and the technician in the endoscopy                            suite. Mental Status Examination: alert and  oriented. Airway Examination: normal oropharyngeal                            airway and neck mobility. Respiratory Examination:                            clear to auscultation. CV Examination: RRR, no                            murmurs, no S3 or S4. Prophylactic Antibiotics: The                            patient does not require prophylactic antibiotics.                            Prior Anticoagulants: The patient  has taken no                            previous anticoagulant or antiplatelet agents. ASA                            Grade Assessment: II - A patient with mild systemic                            disease. After reviewing the risks and benefits,                            the patient was deemed in satisfactory condition to                            undergo the procedure. The anesthesia plan was to                            use moderate sedation / analgesia (conscious                            sedation). Immediately prior to administration of                            medications, the patient was re-assessed for                            adequacy to receive sedatives. The heart rate,                            respiratory rate, oxygen saturations, blood                            pressure, adequacy of pulmonary ventilation, and                            response to care were monitored throughout the  procedure. The physical status of the patient was                            re-assessed after the procedure.                           After obtaining informed consent, the colonoscope                            was passed under direct vision. Throughout the                            procedure, the patient's blood pressure, pulse, and                            oxygen saturations were monitored continuously. The                            [id number]) scope was introduced through                            the anus and advanced to the the cecum, identified                            by the appendiceal orifice, ileocecal valve and                            palpation. No anatomical landmarks were                            photographed. The entire colon was well visualized.                            The colonoscopy was performed without difficulty.                            The quality of the bowel preparation was adequate.                            The  total duration of the procedure was 15 minutes. Scope In: 7:24:50 AM Scope Out: 7:39:22 AM Scope Withdrawal Time: 0 hours 9 minutes 17 seconds  Total Procedure Duration: 0 hours 14 minutes 32 seconds  Findings:      The perianal and digital rectal examinations were normal.      A 2 mm polyp was found in the rectum. The polyp was sessile. The polyp       was removed with a hot snare. Resection and retrieval were complete.       Estimated blood loss: none.      The exam was otherwise without abnormality on direct and retroflexion       views. Impression:               - One 2 mm polyp in the rectum, removed with a hot  snare. Resected and retrieved.                           - The examination was otherwise normal on direct                            and retroflexion views. Moderate Sedation:      Moderate (conscious) sedation was administered by the endoscopy nurse       and supervised by the endoscopist. The following parameters were       monitored: oxygen saturation, heart rate, blood pressure, and response       to care. Recommendation:           - Written discharge instructions were provided to                            the patient.                           - The signs and symptoms of potential delayed                            complications were discussed with the patient.                           - Patient has a contact number available for                            emergencies.                           - Return to normal activities tomorrow.                           - Resume previous diet.                           - Continue present medications.                           - Repeat colonoscopy in 5 years for surveillance                            based on pathology results. Procedure Code(s):        --- Professional ---                           [phone], Colonoscopy, flexible; with removal of                            tumor(s), polyp(s), or  other lesion(s) by snare                            technique Diagnosis Code(s):        --- Professional ---  K62.1, Rectal polyp                           Z86.010, Personal history of colonic polyps CPT copyright 2017 [employer]. All rights reserved. The codes documented in this report are preliminary and upon coder review may  be revised to meet current compliance requirements. Aviva Signs, MD Aviva Signs, MD 01/03/2018 7:45:22 AM This report has been signed electronically. Number of Addenda: 0

## 2018-01-03 NOTE — Interval H&P Note (Signed)
History and Physical Interval Note:  01/03/2018 7:21 AM  Cristian Nguyen  has presented today for surgery, with the diagnosis of history of colon polyps  The various methods of treatment have been discussed with the patient and family. After consideration of risks, benefits and other options for treatment, the patient has consented to  Procedure(s): COLONOSCOPY (N/A) as a surgical intervention .  The patient's history has been reviewed, patient examined, no change in status, stable for surgery.  I have reviewed the patient's chart and labs.  Questions were answered to the patient's satisfaction.     Aviva Signs

## 2018-01-03 NOTE — H&P (Signed)
Cristian Nguyen; [id number]; 1956/01/27   HPI Patient is a 62 year old white male who was referred to my care by Dr. Mickie Hillier for follow-up colonoscopy.  He last had a colonoscopy in 2013 and several polyps were removed.  He does have a family history of colon polyps.  He denies any recent weight loss, blood per rectum, abnormal abdominal pain, diarrhea, constipation.  He currently has no abdominal pain. Past Medical History:  Diagnosis Date  . GERD (gastroesophageal reflux disease)   . Hyperlipidemia   . Hypertension   . Impaired fasting glucose   . Reactive airway disease     Past Surgical History:  Procedure Laterality Date  . CHOLECYSTECTOMY    . COLONOSCOPY  02/29/2012   Procedure: COLONOSCOPY;  Surgeon: Jamesetta So, MD;  Location: [facility];  Service: Gastroenterology;  Laterality: N/A;  . KNEE ARTHROSCOPY  2010    Family History  Problem Relation Age of Onset  . Hypertension Mother   . Heart disease Father   . Cancer Father        bladder  . Cancer Brother        throat    Current Outpatient Medications on File Prior to Visit  Medication Sig Dispense Refill  . esomeprazole (NEXIUM) 40 MG capsule TAKE ONE CAPSULE BY MOUTH ONCE DAILY BEFORE BREAKFAST 30 capsule 5  . glucose blood (FREESTYLE LITE) test strip USE ONE STRIP TO CHECK GLUCOSE ONCE DAILY DX. E11.9 50 each 5  . glucose monitoring kit (FREESTYLE) monitoring kit Test once daily.  Diabetes type 2. E11.9 1 each PRN  . metFORMIN (GLUCOPHAGE) 500 MG tablet Take two bid 60 tablet 5  . simvastatin (ZOCOR) 20 MG tablet Take 1 tablet (20 mg total) by mouth daily with breakfast. 30 tablet 5   No current facility-administered medications on file prior to visit.     No Known Allergies  Social History   Substance and Sexual Activity  Alcohol Use No    Social History   Tobacco Use  Smoking Status Former Smoker  . Years: 35.00  . Types: Cigarettes  . Last attempt to quit: 12/09/2006  . Years since  quitting: 11.0  Smokeless Tobacco Never Used    Review of Systems  Constitutional: Negative.   HENT: Negative.   Eyes: Negative.   Respiratory: Negative.   Cardiovascular: Negative.   Gastrointestinal: Negative.   Genitourinary: Negative.   Musculoskeletal: Negative.   Skin: Negative.   Neurological: Negative.   Endo/Heme/Allergies: Negative.   Psychiatric/Behavioral: Negative.     Objective   Vitals:   12/13/17 1355  BP: (!) 169/98  Pulse: (!) 115  Resp: 20  Temp: 98.9 F (37.2 C)    Physical Exam  Constitutional: He is oriented to person, place, and time. He appears well-developed and well-nourished.  HENT:  Head: Normocephalic and atraumatic.  Cardiovascular: Normal rate, regular rhythm and normal heart sounds. Exam reveals no gallop and no friction rub.  No murmur heard. Pulmonary/Chest: Effort normal and breath sounds normal. No stridor. No respiratory distress. He has no wheezes. He has no rales.  Abdominal: Soft. Bowel sounds are normal. He exhibits no distension and no mass. There is no tenderness. There is no rebound and no guarding.  Neurological: He is alert and oriented to person, place, and time.  Skin: Skin is warm and dry.  Vitals reviewed.  Previous colonoscopy and pathology report reviewed Assessment  History of colon polyps, family history of colon polyps. Plan   Patient is  scheduled for a colonoscopy on 01/03/2018.  The risks and benefits of the procedure including bleeding and perforation were fully explained to the patient, who gave informed consent.  GoLYTELY has been prescribed. 

## 2018-01-03 NOTE — Discharge Instructions (Signed)
Colonoscopy, Adult, Care After  This sheet gives you information about how to care for yourself after your procedure. Your health care provider may also give you more specific instructions. If you have problems or questions, contact your health care provider.  What can I expect after the procedure?  After the procedure, it is common to have:  · A small amount of blood in your stool for 24 hours after the procedure.  · Some gas.  · Mild abdominal cramping or bloating.    Follow these instructions at home:  General instructions    · For the first 24 hours after the procedure:  ? Do not drive or use machinery.  ? Do not sign important documents.  ? Do not drink alcohol.  ? Do your regular daily activities at a slower pace than normal.  ? Eat soft, easy-to-digest foods.  ? Rest often.  · Take over-the-counter or prescription medicines only as told by your health care provider.  · It is up to you to get the results of your procedure. Ask your health care provider, or the department performing the procedure, when your results will be ready.  Relieving cramping and bloating  · Try walking around when you have cramps or feel bloated.  · Apply heat to your abdomen as told by your health care provider. Use a heat source that your health care provider recommends, such as a moist heat pack or a heating pad.  ? Place a towel between your skin and the heat source.  ? Leave the heat on for 20-30 minutes.  ? Remove the heat if your skin turns bright red. This is especially important if you are unable to feel pain, heat, or cold. You may have a greater risk of getting burned.  Eating and drinking  · Drink enough fluid to keep your urine clear or pale yellow.  · Resume your normal diet as instructed by your health care provider. Avoid heavy or fried foods that are hard to digest.  · Avoid drinking alcohol for as long as instructed by your health care provider.  Contact a health care provider if:  · You have blood in your stool 2-3  days after the procedure.  Get help right away if:  · You have more than a small spotting of blood in your stool.  · You pass large blood clots in your stool.  · Your abdomen is swollen.  · You have nausea or vomiting.  · You have a fever.  · You have increasing abdominal pain that is not relieved with medicine.  This information is not intended to replace advice given to you by your health care provider. Make sure you discuss any questions you have with your health care provider.  Document Released: 02/03/2004 Document Revised: 03/15/2016 Document Reviewed: 09/02/2015  Elsevier Interactive Patient Education © 2018 [employer]

## 2018-01-09 ENCOUNTER — Encounter (HOSPITAL_COMMUNITY): Payer: Self-pay | Admitting: General Surgery

## 2018-01-12 ENCOUNTER — Telehealth: Payer: Self-pay | Admitting: General Surgery

## 2018-01-12 NOTE — Telephone Encounter (Signed)
Pathology report shows hyperplastic polyp.  Patient needs only screening colonoscopy in 10 years.  Have made multiple attempts to contact patient and spouse.

## 2018-02-13 DIAGNOSIS — M9905 Segmental and somatic dysfunction of pelvic region: Secondary | ICD-10-CM | POA: Diagnosis not present

## 2018-02-13 DIAGNOSIS — M9903 Segmental and somatic dysfunction of lumbar region: Secondary | ICD-10-CM | POA: Diagnosis not present

## 2018-02-13 DIAGNOSIS — M5441 Lumbago with sciatica, right side: Secondary | ICD-10-CM | POA: Diagnosis not present

## 2018-02-15 DIAGNOSIS — M5441 Lumbago with sciatica, right side: Secondary | ICD-10-CM | POA: Diagnosis not present

## 2018-02-15 DIAGNOSIS — M9905 Segmental and somatic dysfunction of pelvic region: Secondary | ICD-10-CM | POA: Diagnosis not present

## 2018-02-15 DIAGNOSIS — M9903 Segmental and somatic dysfunction of lumbar region: Secondary | ICD-10-CM | POA: Diagnosis not present

## 2018-02-17 DIAGNOSIS — M9903 Segmental and somatic dysfunction of lumbar region: Secondary | ICD-10-CM | POA: Diagnosis not present

## 2018-02-17 DIAGNOSIS — M9905 Segmental and somatic dysfunction of pelvic region: Secondary | ICD-10-CM | POA: Diagnosis not present

## 2018-02-17 DIAGNOSIS — M5441 Lumbago with sciatica, right side: Secondary | ICD-10-CM | POA: Diagnosis not present

## 2018-02-21 DIAGNOSIS — M9905 Segmental and somatic dysfunction of pelvic region: Secondary | ICD-10-CM | POA: Diagnosis not present

## 2018-02-21 DIAGNOSIS — M5441 Lumbago with sciatica, right side: Secondary | ICD-10-CM | POA: Diagnosis not present

## 2018-02-21 DIAGNOSIS — M9903 Segmental and somatic dysfunction of lumbar region: Secondary | ICD-10-CM | POA: Diagnosis not present

## 2018-03-02 DIAGNOSIS — M9903 Segmental and somatic dysfunction of lumbar region: Secondary | ICD-10-CM | POA: Diagnosis not present

## 2018-03-02 DIAGNOSIS — M5441 Lumbago with sciatica, right side: Secondary | ICD-10-CM | POA: Diagnosis not present

## 2018-03-02 DIAGNOSIS — M9905 Segmental and somatic dysfunction of pelvic region: Secondary | ICD-10-CM | POA: Diagnosis not present

## 2018-04-21 ENCOUNTER — Telehealth: Payer: Self-pay | Admitting: Family Medicine

## 2018-04-21 ENCOUNTER — Other Ambulatory Visit: Payer: Self-pay | Admitting: Family Medicine

## 2018-04-21 DIAGNOSIS — E119 Type 2 diabetes mellitus without complications: Secondary | ICD-10-CM

## 2018-04-21 DIAGNOSIS — Z79899 Other long term (current) drug therapy: Secondary | ICD-10-CM

## 2018-04-21 DIAGNOSIS — I10 Essential (primary) hypertension: Secondary | ICD-10-CM

## 2018-04-21 DIAGNOSIS — E785 Hyperlipidemia, unspecified: Secondary | ICD-10-CM

## 2018-04-21 DIAGNOSIS — Z125 Encounter for screening for malignant neoplasm of prostate: Secondary | ICD-10-CM

## 2018-04-21 DIAGNOSIS — Z0000 Encounter for general adult medical examination without abnormal findings: Secondary | ICD-10-CM

## 2018-04-21 NOTE — Telephone Encounter (Signed)
Lip liv m7 A1c psa

## 2018-04-21 NOTE — Telephone Encounter (Signed)
Lab orders placed and pt is aware 

## 2018-04-21 NOTE — Telephone Encounter (Signed)
Pt has an appt on 05/02/18. He would like to have lab work done before the appt. So he can discuss the results at the appt.

## 2018-04-21 NOTE — Telephone Encounter (Signed)
Last labs 10/28/17 lipid, liver, a1c

## 2018-04-26 DIAGNOSIS — I10 Essential (primary) hypertension: Secondary | ICD-10-CM | POA: Diagnosis not present

## 2018-04-26 DIAGNOSIS — Z125 Encounter for screening for malignant neoplasm of prostate: Secondary | ICD-10-CM | POA: Diagnosis not present

## 2018-04-26 DIAGNOSIS — E119 Type 2 diabetes mellitus without complications: Secondary | ICD-10-CM | POA: Diagnosis not present

## 2018-04-26 DIAGNOSIS — Z0000 Encounter for general adult medical examination without abnormal findings: Secondary | ICD-10-CM | POA: Diagnosis not present

## 2018-04-27 ENCOUNTER — Ambulatory Visit: Payer: 59 | Admitting: Family Medicine

## 2018-04-27 LAB — HEPATIC FUNCTION PANEL
ALBUMIN: 3.9 g/dL (ref 3.6–4.8)
ALK PHOS: 85 IU/L (ref 39–117)
ALT: 25 IU/L (ref 0–44)
AST: 19 IU/L (ref 0–40)
Bilirubin Total: 0.7 mg/dL (ref 0.0–1.2)
Bilirubin, Direct: 0.17 mg/dL (ref 0.00–0.40)
TOTAL PROTEIN: 6.1 g/dL (ref 6.0–8.5)

## 2018-04-27 LAB — BASIC METABOLIC PANEL
BUN/Creatinine Ratio: 13 (ref 10–24)
BUN: 11 mg/dL (ref 8–27)
CALCIUM: 9 mg/dL (ref 8.6–10.2)
CHLORIDE: 101 mmol/L (ref 96–106)
CO2: 24 mmol/L (ref 20–29)
Creatinine, Ser: 0.87 mg/dL (ref 0.76–1.27)
GFR calc Af Amer: 108 mL/min/{1.73_m2} (ref >59)
GFR calc non Af Amer: 93 mL/min/{1.73_m2} (ref >59)
Glucose: 160 mg/dL — ABNORMAL HIGH (ref 65–99)
POTASSIUM: 4.3 mmol/L (ref 3.5–5.2)
Sodium: 140 mmol/L (ref 134–144)

## 2018-04-27 LAB — LIPID PANEL
CHOLESTEROL TOTAL: 135 mg/dL (ref 100–199)
Chol/HDL Ratio: 4 ratio (ref 0.0–5.0)
HDL: 34 mg/dL — AB (ref >39)
LDL Calculated: 73 mg/dL (ref 0–99)
TRIGLYCERIDES: 142 mg/dL (ref 0–149)
VLDL CHOLESTEROL CAL: 28 mg/dL (ref 5–40)

## 2018-04-27 LAB — PSA: Prostate Specific Ag, Serum: 1.3 ng/mL (ref 0.0–4.0)

## 2018-04-27 LAB — HEMOGLOBIN A1C
ESTIMATED AVERAGE GLUCOSE: 160 mg/dL
HEMOGLOBIN A1C: 7.2 % — AB (ref 4.8–5.6)

## 2018-05-02 ENCOUNTER — Ambulatory Visit: Payer: 59 | Admitting: Family Medicine

## 2018-05-02 ENCOUNTER — Encounter: Payer: Self-pay | Admitting: Family Medicine

## 2018-05-02 VITALS — BP 152/100 | Ht 73.0 in | Wt 256.0 lb

## 2018-05-02 DIAGNOSIS — Z23 Encounter for immunization: Secondary | ICD-10-CM

## 2018-05-02 DIAGNOSIS — E049 Nontoxic goiter, unspecified: Secondary | ICD-10-CM

## 2018-05-02 DIAGNOSIS — E041 Nontoxic single thyroid nodule: Secondary | ICD-10-CM | POA: Diagnosis not present

## 2018-05-02 DIAGNOSIS — E119 Type 2 diabetes mellitus without complications: Secondary | ICD-10-CM

## 2018-05-02 DIAGNOSIS — I10 Essential (primary) hypertension: Secondary | ICD-10-CM | POA: Diagnosis not present

## 2018-05-02 DIAGNOSIS — E785 Hyperlipidemia, unspecified: Secondary | ICD-10-CM

## 2018-05-02 MED ORDER — LISINOPRIL 5 MG PO TABS: ORAL_TABLET | ORAL | 1 refills | Status: DC

## 2018-05-02 MED ORDER — SIMVASTATIN 20 MG PO TABS: 20.0000 mg | ORAL_TABLET | Freq: Every day | ORAL | 5 refills | Status: DC

## 2018-05-02 MED ORDER — ESOMEPRAZOLE MAGNESIUM 40 MG PO CPDR: DELAYED_RELEASE_CAPSULE | ORAL | 5 refills | Status: DC

## 2018-05-02 MED ORDER — METFORMIN HCL ER 500 MG PO TB24: ORAL_TABLET | ORAL | 5 refills | Status: DC

## 2018-05-02 NOTE — Progress Notes (Signed)
Subjective:    Patient ID: Cristian Nguyen, male    DOB: 09/13/1955, 62 y.o.   MRN: [id number]  HPI  Patient is here today to follow up on chronic health issues. His last A1c was done on 04/26/2018 at 7.2. He states he tries to eat healthy and gets some exercise. He does not see any specialists.  Results for orders placed or performed in visit on 04/21/18  PSA  Result Value Ref Range   Prostate Specific Ag, Serum 1.3 0.0 - 4.0 ng/mL  Hemoglobin A1c  Result Value Ref Range   Hgb A1c MFr Bld 7.2 (H) 4.8 - 5.6 %   Est. average glucose Bld gHb Est-mCnc 160 mg/dL  Basic Metabolic Panel (BMET)  Result Value Ref Range   Glucose 160 (H) 65 - 99 mg/dL   BUN 11 8 - 27 mg/dL   Creatinine, Ser 0.87 0.76 - 1.27 mg/dL   GFR calc non Af Amer 93 >59 mL/min/1.73   GFR calc Af Amer 108 >59 mL/min/1.73   BUN/Creatinine Ratio 13 10 - 24   Sodium 140 134 - 144 mmol/L   Potassium 4.3 3.5 - 5.2 mmol/L   Chloride 101 96 - 106 mmol/L   CO2 24 20 - 29 mmol/L   Calcium 9.0 8.6 - 10.2 mg/dL  Hepatic function panel  Result Value Ref Range   Total Protein 6.1 6.0 - 8.5 g/dL   Albumin 3.9 3.6 - 4.8 g/dL   Bilirubin Total 0.7 0.0 - 1.2 mg/dL   Bilirubin, Direct 0.17 0.00 - 0.40 mg/dL   Alkaline Phosphatase 85 39 - 117 IU/L   AST 19 0 - 40 IU/L   ALT 25 0 - 44 IU/L  Lipid Profile  Result Value Ref Range   Cholesterol, Total 135 100 - 199 mg/dL   Triglycerides 142 0 - 149 mg/dL   HDL 34 (L) >39 mg/dL   VLDL Cholesterol Cal 28 5 - 40 mg/dL   LDL Calculated 73 0 - 99 mg/dL   Chol/HDL Ratio 4.0 0.0 - 5.0 ratio    Patient continues to take lipid medication regularly. No obvious side effects from it. Generally does not miss a dose. Prior blood work results are reviewed with patient. Patient continues to work on fat intake in diet  Patient claims compliance with diabetes medication. No obvious side effects. Reports no substantial low sugar spells. Most numbers are generally in good range when checked  fasting. Generally does not miss a dose of medication. Watching diabetic diet closely  Blood pressure medicine and blood pressure levels reviewed today with patient. Compliant with blood pressure medicine. States does not miss a dose. No obvious side effects. Blood pressure generally good when checked elsewhere. Watching salt intake.  Patient also notes swelling in his neck.  Primarily on the right side.    Review of Systems No headache, no major weight loss or weight gain, no chest pain no back pain abdominal pain no change in bowel habits complete ROS otherwise negative     Objective:   Physical Exam  Alert and oriented, vitals reviewed and stable, NAD ENT-TM's and ext canals WNL bilat via otoscopic exam Soft palate, tonsils and post pharynx WNL via oropharyngeal exam Neck-symmetric, no masses; thyroid nonpalpable and nontender Pulmonary-no tachypnea or accessory muscle use; Clear without wheezes via auscultation Card--no abnrml murmurs, rhythm reg and rate WNL Carotid pulses symmetric, without bruits       Assessment & Plan:  Impression hypertension.  Good on repeat to  maintain same meds  2.  Hyperlipidemia.  Control good compliance discussed maintain  3.  Type 2 diabetes good control though not perfect.  Discussed.  Will shift to Metformin XR at 2 4 g/day.  Diet discussed  4.  Enlarged thyroid/thyroid nodule  Medication refilled diet exercise discussed appropriate blood work plus ultrasound rationale discussed/further recommendations based on results

## 2018-05-02 NOTE — Patient Instructions (Signed)
Results for orders placed or performed in visit on 04/21/18  PSA  Result Value Ref Range   Prostate Specific Ag, Serum 1.3 0.0 - 4.0 ng/mL  Hemoglobin A1c  Result Value Ref Range   Hgb A1c MFr Bld 7.2 (H) 4.8 - 5.6 %   Est. average glucose Bld gHb Est-mCnc 160 mg/dL  Basic Metabolic Panel (BMET)  Result Value Ref Range   Glucose 160 (H) 65 - 99 mg/dL   BUN 11 8 - 27 mg/dL   Creatinine, Ser 0.87 0.76 - 1.27 mg/dL   GFR calc non Af Amer 93 >59 mL/min/1.73   GFR calc Af Amer 108 >59 mL/min/1.73   BUN/Creatinine Ratio 13 10 - 24   Sodium 140 134 - 144 mmol/L   Potassium 4.3 3.5 - 5.2 mmol/L   Chloride 101 96 - 106 mmol/L   CO2 24 20 - 29 mmol/L   Calcium 9.0 8.6 - 10.2 mg/dL  Hepatic function panel  Result Value Ref Range   Total Protein 6.1 6.0 - 8.5 g/dL   Albumin 3.9 3.6 - 4.8 g/dL   Bilirubin Total 0.7 0.0 - 1.2 mg/dL   Bilirubin, Direct 0.17 0.00 - 0.40 mg/dL   Alkaline Phosphatase 85 39 - 117 IU/L   AST 19 0 - 40 IU/L   ALT 25 0 - 44 IU/L  Lipid Profile  Result Value Ref Range   Cholesterol, Total 135 100 - 199 mg/dL   Triglycerides 142 0 - 149 mg/dL   HDL 34 (L) >39 mg/dL   VLDL Cholesterol Cal 28 5 - 40 mg/dL   LDL Calculated 73 0 - 99 mg/dL   Chol/HDL Ratio 4.0 0.0 - 5.0 ratio

## 2018-05-03 LAB — T4, FREE: Free T4: 1.81 ng/dL — ABNORMAL HIGH (ref 0.82–1.77)

## 2018-05-03 LAB — TSH

## 2018-05-05 ENCOUNTER — Ambulatory Visit (HOSPITAL_COMMUNITY)
Admission: RE | Admit: 2018-05-05 | Discharge: 2018-05-05 | Disposition: A | Discharge status: Home / Self Care | Payer: 59 | Source: Ambulatory Visit | Attending: Family Medicine | Admitting: Family Medicine

## 2018-05-05 DIAGNOSIS — E049 Nontoxic goiter, unspecified: Secondary | ICD-10-CM | POA: Diagnosis not present

## 2018-05-05 DIAGNOSIS — E041 Nontoxic single thyroid nodule: Secondary | ICD-10-CM | POA: Diagnosis not present

## 2018-05-09 ENCOUNTER — Encounter: Payer: Self-pay | Admitting: Family Medicine

## 2018-05-09 ENCOUNTER — Ambulatory Visit: Payer: 59 | Admitting: Family Medicine

## 2018-05-09 VITALS — BP 122/82 | Ht 73.0 in | Wt 254.2 lb

## 2018-05-09 DIAGNOSIS — E0590 Thyrotoxicosis, unspecified without thyrotoxic crisis or storm: Secondary | ICD-10-CM

## 2018-05-09 DIAGNOSIS — E041 Nontoxic single thyroid nodule: Secondary | ICD-10-CM | POA: Diagnosis not present

## 2018-05-09 NOTE — Progress Notes (Signed)
   Subjective:    Patient ID: Cristian Nguyen, male    DOB: 07-Aug-1955, 62 y.o.   MRN: [id number]  HPI  Patient arrives to discuss results of recent thyroid lab work. Patient reports no other concerns.  Please see prior notes  Patient presented with a swelling in his neck noticeable in recent weeks.  On retrospect patient states wife states a change in tone allergy of voice over the past couple months.  No neck pain.  No symptoms of hyperthyroidism other than heat intolerance.  No diarrhea no weight loss no tachyarrhythmias no tremulousness  Strong family history of cancer which understandably makes patient anxious     Thyroid  b w shows hyperthyroid   Review of Systems No headache, no major weight loss or weight gain, no chest pain no back pain abdominal pain no change in bowel habits complete ROS otherwise negative     Objective:   Physical Exam  Alert and oriented, vitals reviewed and stable, NAD ENT-TM's and ext canals WNL bilat via otoscopic exam Soft palate, tonsils and post pharynx WNL via oropharyngeal exam Neck-symmetric, no masses; thyroid nonpalpable and nontender Pulmonary-no tachypnea or accessory muscle use; Clear without wheezes via auscultation Card--no abnrml murmurs, rhythm reg and rate WNL Carotid pulses symmetric, without bruits       Assessment & Plan:  Impression 1 hyperthyroidism.  Substantial.  TSH very low.  T4 elevated.  This in combination with a complex right thyroid nodule/cyst.  Please see ultrasound results.  Long discussion held.  Between the hyperthyroidismIn the thyroid cyst I think an endocrinologist is the best person to be the captain of this shift.  Patient is going to need to have intervention on his hyper thyroidism apart from a diagnostic testing of the nodule and or consideration towards eventual removal.  Discussed at length multiple questions answered  We will press on with referral

## 2018-05-23 NOTE — Progress Notes (Signed)
Name: Cristian Nguyen  MRN/ DOB: [id number], 01/20/1956    Age/ Sex: 62 y.o., male    PCP: Cristian Kirschner, MD   Reason for Endocrinology Evaluation: Thyroid nodule     Date of Initial Endocrinology Evaluation: 05/24/2018     HPI: [name] is a 62 y.o. male with a past medical history of HTN, T2DM and GERD. The patient presented for initial endocrinology clinic visit on 05/24/2018 for consultative assistance with his Hyperthyroidism.   He presented to his PCP in October, 2019 with c/o neck enlargement, a thyroid ultrasound showed a 6.8 cm right thyroid solid cystic nodule.    Pt noted right neck enlargement ~ 6 weeks ago, without much change in size since then, wife noted voice hoarseness but denies dysphagia or  sob.  No prior exposure to radiation.   Significant FH of bladder, throat and lung cancer but no thyroid disease or thyroid cancer.    Former smoker  Used to work for tobacco company  Pt has heat intolerance, mild tremors that were not in the past few months  Denies weight loss, diarrhea , nor anxiety or jittery sensation.   HISTORY:  Past Medical History:  Past Medical History:  Diagnosis Date  . GERD (gastroesophageal reflux disease)   . Hyperlipidemia   . Hypertension   . Impaired fasting glucose   . Reactive airway disease    Past Surgical History:  Past Surgical History:  Procedure Laterality Date  . CHOLECYSTECTOMY    . COLONOSCOPY  02/29/2012   Procedure: COLONOSCOPY;  Surgeon: Jamesetta So, MD;  [facility];  Service: Gastroenterology;  Laterality: N/A;  . COLONOSCOPY N/A 01/03/2018   Procedure: COLONOSCOPY;  Surgeon: Aviva Signs, MD;  [facility];  Service: Gastroenterology;  Laterality: N/A;  . KNEE ARTHROSCOPY  2010      Social History:  reports that he quit smoking about 11 years ago. His smoking use included cigarettes. He quit after 35.00 years of use. He has never used smokeless tobacco. He reports that  he does not drink alcohol or use drugs.  Family History: family history includes Cancer in his brother and father; Heart disease in his father; Hypertension in his mother.   HOME MEDICATIONS: Current Outpatient Medications on File Prior to Visit  Medication Sig Dispense Refill  . esomeprazole (NEXIUM) 40 MG capsule TAKE ONE CAPSULE BY MOUTH ONCE DAILY BEFORE BREAKFAST 30 capsule 5  . lisinopril (PRINIVIL,ZESTRIL) 5 MG tablet One po QHS. 90 tablet 1  . metFORMIN (GLUCOPHAGE XR) 500 MG 24 hr tablet Take four tablets Q am . 120 tablet 5  . simvastatin (ZOCOR) 20 MG tablet Take 1 tablet (20 mg total) by mouth daily with breakfast. 30 tablet 5   No current facility-administered medications on file prior to visit.       REVIEW OF SYSTEMS: A comprehensive ROS was conducted with the patient and is negative except as per HPI and below:  Review of Systems  Constitutional: Positive for malaise/fatigue. Negative for weight loss.  HENT: Negative for congestion and sore throat.   Eyes: Negative for blurred vision and pain.  Respiratory: Negative for cough and shortness of breath.   Cardiovascular: Positive for palpitations. Negative for chest pain.  Gastrointestinal: Negative for diarrhea and nausea.  Genitourinary: Negative for frequency.  Skin: Positive for rash.       Left shin   Neurological: Positive for tremors. Negative for tingling.  Endo/Heme/Allergies: Positive for polydipsia.  Psychiatric/Behavioral: Negative for depression. The patient is not nervous/anxious.         OBJECTIVE:  VS: BP (!) 148/90   Pulse 88   Resp 16   Wt 255 lb (115.7 kg)   SpO2 98%   BMI 33.64 kg/m    Wt Readings from Last 3 Encounters:  05/24/18 255 lb (115.7 kg)  05/09/18 254 lb 3.2 oz (115.3 kg)  05/02/18 256 lb 0.6 oz (116.1 kg)     EXAM: General: Pt appears well and is in NAD  Hydration: Well-hydrated with moist mucous membranes and good skin turgor  Eyes: External eye exam normal without  stare, lid lag or exophthalmos.  EOM intact.  PERRL.  Ears, Nose, Throat: Hearing: Grossly intact bilaterally Dental: Good dentition  Throat: Clear without mass, erythema or exudate  Neck: General: Supple without adenopathy. Thyroid: Thyroid size normal on the left, patient does have a nodular enlargement on the right. No thyroid bruit.  Lungs: Clear with good BS bilat with no rales, rhonchi, or wheezes  Heart: Auscultation: RRR.  Abdomen: Normoactive bowel sounds, soft, nontender, without masses or organomegaly palpable  Extremities:  BL LE: No pretibial edema normal ROM and strength.  Skin: Hair: Texture and amount normal with gender appropriate distribution Skin Inspection: No rashes, acanthosis nigricans/skin tags.  Skin Palpation: Skin temperature, texture, and thickness normal to palpation  Neuro: Cranial nerves: II - XII grossly intact  Motor: Normal strength throughout DTRs: 2+ and symmetric in UE without delay in relaxation phase  Mental Status: Judgment, insight: Intact Orientation: Oriented to time, place, and person Mood and affect: No depression, anxiety, or agitation     DATA REVIEWED: Results for Cristian, Nguyen (MRN [id number]) as of 05/23/2018 16:12  Ref. Range 05/02/2018 10:53  TSH Latest Ref Range: 0.450 - 4.500 uIU/mL <0.006 (L)  T4,Free(Direct) Latest Ref Range: 0.82 - 1.77 ng/dL 1.81 (H)    Thyroid Ultrasound (05/05/18)  Parenchymal Echotexture: Markedly heterogenous  Isthmus: 0.6 cm  Right lobe: 8.0 x 4.7 x 4.1 cm  Left lobe: 4.4 x 1.7 x 1.2 cm  _________________________________________________________  Estimated total number of nodules >/= 1 cm: 1  Number of spongiform nodules >/=  2 cm not described below (TR1): 0  Number of mixed cystic and solid nodules >/= 1.5 cm not described below (TR2): 0  _________________________________________________________  Nodule # 1:  Location: Right; Mid  Maximum size: 6.8 cm; Other 2  dimensions: 5.4 x 4.4 cm  Composition: mixed cystic and solid (1)  Echogenicity: isoechoic (1)  Shape: not taller-than-wide (0)  Margins: smooth (0)  Echogenic foci: none (0)  ACR TI-RADS total points: 2.  ACR TI-RADS risk category: TR2 (2 points).  ACR TI-RADS recommendations:  This nodule does NOT meet TI-RADS criteria for biopsy or dedicated follow-up.  _________________________________________________________  IMPRESSION: Large right complex solid and cystic nodule 1 does not meet criteria for biopsy nor follow-up.   Old records , labs and images have been reviewed.  I personally have reviewed the ultrasound images.   ASSESSMENT/PLAN/RECOMMENDATIONS:   1. Hyperthyroidism :   - Pt is clinically euthyroid but biochemically hyperthyroid.  - We discussed D/D of Graves' disease (less likely at this time) vs a toxic adenoma , given he has a large right thyroid nodule.  - We discussed the importance to proceed with thyroid uptake and scan to determine if this is toxic adenoma vs graves'  - Repeat TFT's showed continued TSH suppression, high T3 and normalized T4, but due to  high T3 will proceed with Methimazole . - He understands, he will need to stop it 5 days prior to the uptake and scan  - We discussed with pt the benefits of methimazole in the Tx of hyperthyroidism, as well as the possible side effects/complications of anti-thyroid drug Tx (specifically detailing the rare, but serious side effect of agranulocytosis). He was informed of need for regular thyroid function monitoring while on methimazole to ensure appropriate dosage without over-treatment. As well, we discussed the possible side effects of methimazole including the chance of rash, the small chance of liver irritation/juandice and the <=1 in 300-400 chance of sudden onset agranulocytosis.  We discussed importance of going to ED promptly (and stopping methimazole) if hewere to develop significant fever  with severe sore throat of other evidence of acute infection.     We extensively discussed the various treatment options for hyperthyroidism and Graves disease including ablation therapy with radioactive iodine versus antithyroid drug treatment versus surgical therapy.We discussed the various possible benefits versus side effects of the various therapies.     2. Right thyroid solid- cystic lesion:  - This measures 5.4 x 4.4 x 6.8 cm. Mostly cystic but with what appears to be micro-calcifications on certain cuts.  - Will obtain uptake and scan. If this is a "hot nodule" no risk of malignancy but if this is a "cold" or a "warm nodule" will proceed with FNA.     F/u in 6 weeks.    Addendum: Results discussed with pt on 11/21/ @ 0815   Signed electronically by: Mack Guise, MD  [facility]  [facility] [address] [place], [place] [address] Phone: [phone] FAX: [phone]   CC: Cristian Nguyen, [place] [address] Phone: [phone] Fax: [phone]   Return to Endocrinology clinic as below: Future Appointments  Date Time Provider [place]  07/07/2018  9:10 AM Tanvir Hipple, Melanie Crazier, MD [facility] None  11/01/2018  8:40 AM Cristian Kirschner, MD [facility] [place]

## 2018-05-24 ENCOUNTER — Ambulatory Visit: Payer: 59 | Admitting: Internal Medicine

## 2018-05-24 ENCOUNTER — Encounter: Payer: Self-pay | Admitting: Internal Medicine

## 2018-05-24 VITALS — BP 148/90 | HR 88 | Resp 16 | Ht 73.0 in | Wt 255.0 lb

## 2018-05-24 DIAGNOSIS — E0590 Thyrotoxicosis, unspecified without thyrotoxic crisis or storm: Secondary | ICD-10-CM | POA: Diagnosis not present

## 2018-05-24 DIAGNOSIS — E041 Nontoxic single thyroid nodule: Secondary | ICD-10-CM | POA: Diagnosis not present

## 2018-05-24 LAB — T4, FREE: FREE T4: 1.35 ng/dL (ref 0.60–1.60)

## 2018-05-24 LAB — TSH: TSH: <0.01 u[IU]/mL — ABNORMAL LOW (ref 0.35–4.50)

## 2018-05-24 NOTE — Patient Instructions (Signed)
-   We will repeat your thyroid function tests today, we will contact you about Methimazole dosing if needed.  - If Methimazole is started please remember the following  If you develop severe sore throat with high fevers OR develop unexplained yellowing of your skin, eyes, under your tongue, severe abdominal pain with nausea or vomiting --> then please get evaluated immediately.   - We will set you up for thyroid uptake and scan , this is a 2 day process, you will need to present to the radiology department on 2 consecutive days  - Please STOP [place] and Scan appointment.

## 2018-05-25 ENCOUNTER — Telehealth: Payer: Self-pay | Admitting: Internal Medicine

## 2018-05-25 LAB — T3: T3 TOTAL: 239 ng/dL — AB (ref 76–181)

## 2018-05-25 MED ORDER — METHIMAZOLE 10 MG PO TABS: 10.0000 mg | ORAL_TABLET | Freq: Three times a day (TID) | ORAL | 3 refills | Status: DC

## 2018-05-25 NOTE — Telephone Encounter (Signed)
Please clarify dosing, pt stated that he thought he would be on one pill a day but rx states one pill 3 times a day.

## 2018-05-25 NOTE — Telephone Encounter (Signed)
Patient is unsure about rx directions and would like clarification. Please Advise, thanks methimazole (TAPAZOLE) 10 MG tablet

## 2018-05-26 ENCOUNTER — Other Ambulatory Visit: Payer: Self-pay

## 2018-05-26 MED ORDER — METHIMAZOLE 10 MG PO TABS: 10.0000 mg | ORAL_TABLET | Freq: Every day | ORAL | 3 refills | Status: DC

## 2018-05-26 NOTE — Telephone Encounter (Signed)
It's once a day ... the system has it pre-set  for three times a day and I didn't catch that when I sent it to him .   I wish there's a way to change the pre-set orders But it should be once a day   Thank you

## 2018-05-26 NOTE — Telephone Encounter (Signed)
Pt informed and I went into chart and updated rx

## 2018-06-08 ENCOUNTER — Encounter (HOSPITAL_COMMUNITY)
Admission: RE | Admit: 2018-06-08 | Discharge: 2018-06-08 | Disposition: A | Discharge status: Home / Self Care | Payer: 59 | Source: Ambulatory Visit | Attending: Internal Medicine | Admitting: Internal Medicine

## 2018-06-08 ENCOUNTER — Encounter (HOSPITAL_COMMUNITY): Payer: Self-pay

## 2018-06-08 DIAGNOSIS — E0590 Thyrotoxicosis, unspecified without thyrotoxic crisis or storm: Secondary | ICD-10-CM | POA: Diagnosis not present

## 2018-06-08 MED ORDER — SODIUM IODIDE I-123 7.4 MBQ CAPS
461.0000 | ORAL_CAPSULE | Freq: Once | ORAL | Status: AC
  Administered 2018-06-08: 461 via ORAL

## 2018-06-09 ENCOUNTER — Encounter (HOSPITAL_COMMUNITY)
Admission: RE | Admit: 2018-06-09 | Discharge: 2018-06-09 | Disposition: A | Discharge status: Home / Self Care | Payer: 59 | Source: Ambulatory Visit | Attending: Internal Medicine | Admitting: Internal Medicine

## 2018-06-09 DIAGNOSIS — E0590 Thyrotoxicosis, unspecified without thyrotoxic crisis or storm: Secondary | ICD-10-CM | POA: Diagnosis not present

## 2018-06-09 DIAGNOSIS — E041 Nontoxic single thyroid nodule: Secondary | ICD-10-CM | POA: Diagnosis not present

## 2018-06-12 ENCOUNTER — Telehealth: Payer: Self-pay | Admitting: Internal Medicine

## 2018-06-12 NOTE — Telephone Encounter (Signed)
Per Patient he is looking for the results of his 2 day scans.  Please call back at [phone]

## 2018-06-12 NOTE — Telephone Encounter (Signed)
Please advise 

## 2018-06-13 ENCOUNTER — Telehealth: Payer: Self-pay | Admitting: Internal Medicine

## 2018-06-13 DIAGNOSIS — E0590 Thyrotoxicosis, unspecified without thyrotoxic crisis or storm: Secondary | ICD-10-CM

## 2018-06-13 DIAGNOSIS — E041 Nontoxic single thyroid nodule: Secondary | ICD-10-CM

## 2018-06-13 NOTE — Telephone Encounter (Signed)
Per our discussion, please call pt to further discuss. If pt still needs appt, please let me know and I will schedule.

## 2018-06-13 NOTE — Telephone Encounter (Signed)
Discussed results with patient and his spouse over the phone.   We discussed that part of the nodule is "hot" which is responsible for hyperthyroidism.  We also discussed that part of the nodule is "cold"  most likely due to fluid content.  We discussed options for treatment such as RAI blation vs surgical resection.   Given the size of the nodule and hyperactivity , I would recommend proceeding with surgical resection.    Pt agreed to the above.   Will refer to surgery     Results  Normal 4 hour and 24 hour I 123 uptake. 2. Large complex nodule involving the right thyroid lobe with areas of moderate, mild and no uptake.   Abby Nena Jordan, MD  [facility] Endocrinology  [facility] [address] [place], [place] [address] Phone: [phone] FAX: [phone]

## 2018-07-06 NOTE — Progress Notes (Signed)
Name: Cristian Nguyen  MRN/ DOB: [id number], 12-13-1955    Age/ Sex: 63 y.o., male     PCP: Mikey Kirschner, MD   Reason for Endocrinology Evaluation: 05/24/2018     Initial Endocrinology Clinic Visit: Hyperthyroidism     PATIENT IDENTIFIER: [name] is a 63 y.o., male with a past medical history of HTN, T2DM and GERD . He has followed with [facility] since 05/24/18 for consultative assistance with management of his hyperthyroidism.   HISTORICAL SUMMARY: The patient presented to his PCP in October, 2019 with c/o neck enlargement, a thyroid ultrasound showed a 6.8 cm right thyroid solid cystic nodule.    SUBJECTIVE:   During last visit (05/24/2018): He was started on 10 mg of Methimazole due to low TSH and high FT4.  Today (07/07/2018):  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 He has noted worsening neck enlargement. He feels some discomfort on the right side of his throat especially when he eats. He denies any hyper or hypothyroid symptoms such as weight changes, palpitations, heat intolerance, no diarrhea or constipation.   He is scheduled to see Dr. Harlow Asa on 07/26/18  His wife already has her hip replacement.    ROS:  As per HPI.   HISTORY:  Past Medical History:  Past Medical History:  Diagnosis Date  . GERD (gastroesophageal reflux disease)   . Hyperlipidemia   . Hypertension   . Impaired fasting glucose   . Reactive airway disease    Past Surgical History:  Past Surgical History:  Procedure Laterality Date  . CHOLECYSTECTOMY    . COLONOSCOPY  02/29/2012   Procedure: COLONOSCOPY;  Surgeon: Jamesetta So, MD;  [facility];  Service: Gastroenterology;  Laterality: N/A;  . COLONOSCOPY N/A 01/03/2018   Procedure: COLONOSCOPY;  Surgeon: Aviva Signs, MD;  [facility];  Service: Gastroenterology;  Laterality: N/A;  . KNEE ARTHROSCOPY  2010    Social History:  reports that he quit  smoking about 11 years ago. His smoking use included cigarettes. He quit after 35.00 years of use. He has never used smokeless tobacco. He reports that he does not drink alcohol or use drugs.  Family History: family history includes Cancer in his brother and father; Heart disease in his father; Hypertension in his mother.   HOME MEDICATIONS: Allergies as of 07/07/2018   No Known Allergies     Medication List       Accurate as of July 07, 2018  4:26 PM. Always use your most recent med list.        esomeprazole 40 MG capsule Commonly known as:  NEXIUM TAKE ONE CAPSULE BY MOUTH ONCE DAILY BEFORE BREAKFAST   lisinopril 5 MG tablet Commonly known as:  PRINIVIL,ZESTRIL One po QHS.   metFORMIN 500 MG 24 hr tablet Commonly known as:  GLUCOPHAGE XR Take four tablets Q am .   methimazole 10 MG tablet Commonly known as:  TAPAZOLE Take 1 tablet (10 mg total) by mouth daily.   simvastatin 20 MG tablet Commonly known as:  ZOCOR Take 1 tablet (20 mg total) by mouth daily with breakfast.          OBJECTIVE:   PHYSICAL EXAM: VS: BP 128/84 (BP Location: Left Arm, Patient Position: Sitting, Cuff Size: Normal)   Pulse 79   Ht 6\' 1"  (1.854 m)   Wt 255 lb 12.8 oz (116 kg)   SpO2  94%   BMI 33.75 kg/m    EXAM: General: Pt appears well and is in NAD  Hydration: Well-hydrated with moist mucous membranes and good skin turgor  Eyes: External eye exam normal without stare, lid lag or exophthalmos.  EOM intact.  PERRL.  Ears, Nose, Throat: Hearing: Grossly intact bilaterally Dental: Good dentition  Throat: Clear without mass, erythema or exudate  Neck: General: Supple without adenopathy. Thyroid: Thyroid size normal with right thyroid nodule, firm in consistency.  No goiter or nodules appreciated. No thyroid bruit.  Lungs: Clear with good BS bilat with no rales, rhonchi, or wheezes  Heart: Auscultation: RRR.  Abdomen: Normoactive bowel sounds, soft, nontender, without masses or  organomegaly palpable  Extremities:  BL LE: No pretibial edema normal ROM and strength.  Mental Status: Judgment, insight: Intact Orientation: Oriented to time, place, and person Mood and affect: No depression, anxiety, or agitation     DATA REVIEWED:  Results for ANDRIS, BROTHERS (MRN [id number]) as of 07/07/2018 16:27  Ref. Range 05/02/2018 10:53 05/24/2018 10:20 07/07/2018 09:59  TSH Latest Ref Range: 0.35 - 4.50 uIU/mL <0.006 (L) <0.01 Repeated and verified X2. (L) <0.01 (L)  Triiodothyronine (T3) Latest Ref Range: 76 - 181 ng/dL  239 (H)   T4,Free(Direct) Latest Ref Range: 0.60 - 1.60 ng/dL 1.81 (H) 1.35 1.46    Thyroid Ultrasound 05/05/2018  Nodule # 1:  Location: Right; Mid  Maximum size: 6.8 cm; Other 2 dimensions: 5.4 x 4.4 cm  Composition: mixed cystic and solid (1)  Echogenicity: isoechoic (1)  Shape: not taller-than-wide (0)  Margins: smooth (0)  Echogenic foci: none (0)  ACR TI-RADS total points: 2.  ACR TI-RADS risk category: TR2 (2 points).  ACR TI-RADS recommendations:  This nodule does NOT meet TI-RADS criteria for biopsy or dedicated follow-up.  _________________________________________________________  IMPRESSION: Large right complex solid and cystic nodule 1 does not meet criteria for biopsy nor follow-up.  Thyroid Uptake and Scan (06/09/2018)  Large complex mass involving the right thyroid lobe with areas of decreased activity apparently corresponding to the cystic portion of this mass seen on ultrasound. The left thyroid lobe is normal in size and it's uptake is somewhat depressed.  4 hour I-123 uptake = 12.2% (normal 5-20%)  24 hour I-123 uptake = 25.2% (normal 10-30%)  IMPRESSION: 1. Normal 4 hour and 24 hour I 123 uptake. 2. Large complex nodule involving the right thyroid lobe with areas of moderate, mild and no uptake.    ASSESSMENT / PLAN / RECOMMENDATIONS:   1. Right Toxic Adenoma :  - Pt is  clinically euthyroid  - Pt is c/o local neck pain and discomfort.  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 The left thyroid lobe has somewhat depressed uptake.  - Given the size of the nodule, and local neck symptoms that the patient is complaining of , I have advised him to proceed with right hemithyroidectomy  - He is scheduled to see Dr. Harlow Asa on 07/26/18 to discuss this further.  - We discussed that after the surgery, his left thyroid lobe may resume activity and may not need any LT-4 replacement.   Medications Continue Methimazole 10 mg daily   F/U 6 weeks post-op    Signed electronically by: Mack Guise, MD  [facility] Endocrinology  [facility] [address]  [place], [place] [address] Phone: [phone] FAX: [phone]      CC: Mikey Kirschner, [place] [place] [address] Phone: [phone]  Fax: [phone]   Return to Endocrinology clinic as below: Future Appointments  Date Time Provider [place]  09/01/2018  9:30 AM Roisin Mones, Melanie Crazier, MD [facility] None  11/01/2018  8:40 AM Mikey Kirschner, MD [facility] [place]

## 2018-07-07 ENCOUNTER — Encounter: Payer: Self-pay | Admitting: Internal Medicine

## 2018-07-07 ENCOUNTER — Ambulatory Visit: Payer: 59 | Admitting: Internal Medicine

## 2018-07-07 VITALS — BP 128/84 | HR 79 | Ht 73.0 in | Wt 255.8 lb

## 2018-07-07 DIAGNOSIS — E0590 Thyrotoxicosis, unspecified without thyrotoxic crisis or storm: Secondary | ICD-10-CM

## 2018-07-07 DIAGNOSIS — E041 Nontoxic single thyroid nodule: Secondary | ICD-10-CM

## 2018-07-07 DIAGNOSIS — E0510 Thyrotoxicosis with toxic single thyroid nodule without thyrotoxic crisis or storm: Secondary | ICD-10-CM | POA: Diagnosis not present

## 2018-07-07 LAB — T4, FREE: FREE T4: 1.46 ng/dL (ref 0.60–1.60)

## 2018-07-07 LAB — TSH: TSH: <0.01 u[IU]/mL — ABNORMAL LOW (ref 0.35–4.50)

## 2018-07-07 NOTE — Patient Instructions (Signed)
-   We recommend that you follow these hyperthyroidism instructions at home:  1) Take Methimazole 10 mg once a day  If you develop severe sore throat with high fevers OR develop unexplained yellowing of your skin, eyes, under your tongue, severe abdominal pain with nausea or vomiting --> then please get evaluated immediately.   DO NOT RESTART METHIMAZOLE AFTER THE SURGERY    It is ESSENTIAL to get follow-up labs to help avoid over or undertreatment of your hyperthyroidism - both of which can be dangerous to your health.

## 2018-07-31 ENCOUNTER — Other Ambulatory Visit: Payer: Self-pay | Admitting: Dermatology

## 2018-07-31 DIAGNOSIS — C44319 Basal cell carcinoma of skin of other parts of face: Secondary | ICD-10-CM | POA: Diagnosis not present

## 2018-07-31 DIAGNOSIS — C4491 Basal cell carcinoma of skin, unspecified: Secondary | ICD-10-CM

## 2018-07-31 DIAGNOSIS — L309 Dermatitis, unspecified: Secondary | ICD-10-CM | POA: Diagnosis not present

## 2018-07-31 HISTORY — DX: Basal cell carcinoma of skin, unspecified: C44.91

## 2018-08-09 ENCOUNTER — Ambulatory Visit: Payer: Self-pay | Admitting: Surgery

## 2018-08-09 DIAGNOSIS — E0510 Thyrotoxicosis with toxic single thyroid nodule without thyrotoxic crisis or storm: Secondary | ICD-10-CM | POA: Diagnosis not present

## 2018-08-11 NOTE — Patient Instructions (Signed)
Cristian Nguyen  1955/08/08      Your procedure is scheduled on:  08-15-2018    Report to [facility] Main  Entrance,  Report to admitting at  5:30 AM    Call this number if you have problems the morning of surgery [phone]       Remember: Do not eat food or drink liquids :After Midnight.                                        BRUSH YOUR TEETH MORNING OF SURGERY AND RINSE YOUR MOUTH OUT, NO CHEWING GUM CANDY OR MINTS.     Take these medicines the morning of surgery with A SIP OF WATER:  Esomeprazole (nexium), Methimazole (tapazole), Simvastatin (zocor)   DO NOT TAKE ANY DIABETIC MEDICATIONS DAY OF YOUR SURGERY                                   You may not have any metal on your body including hair pins and               piercings  Do not wear jewelry, make-up, lotions, powders or perfumes, deodorant                        Men may shave face and neck.       Do not bring valuables to the hospital. [place].  Contacts, dentures or bridgework may not be worn into surgery.  Leave suitcase in the car. After surgery it may be brought to your room.     ____________________________________________________________________________________________          [facility] - Preparing for Surgery Before surgery, you can play an important role.  Because skin is not sterile, your skin needs to be as free of germs as possible.  You can reduce the number of germs on your skin by washing with CHG (chlorahexidine gluconate) soap before surgery.  CHG is an antiseptic cleaner which kills germs and bonds with the skin to continue killing germs even after washing. Please DO NOT use if you have an allergy to CHG or antibacterial soaps.  If your skin becomes reddened/irritated stop using the CHG and inform your nurse when you arrive at Short Stay. Do not shave (including legs and underarms) for at least 48 hours  prior to the first CHG shower.  You may shave your face/neck. Please follow these instructions carefully:  1.  Shower with CHG Soap the night before surgery and the  morning of Surgery.  2.  If you choose to wash your hair, wash your hair first as usual with your  normal  shampoo.  3.  After you shampoo, rinse your hair and body thoroughly to remove the  shampoo.                            4.  Use CHG as you would any other liquid soap.  You can apply chg directly  to the skin and wash  Gently with a scrungie or clean washcloth.  5.  Apply the CHG Soap to your body ONLY FROM THE NECK DOWN.   Do not use on face/ open                           Wound or open sores. Avoid contact with eyes, ears mouth and genitals (private parts).                       Wash face,  Genitals (private parts) with your normal soap.             6.  Wash thoroughly, paying special attention to the area where your surgery  will be performed.  7.  Thoroughly rinse your body with warm water from the neck down.  8.  DO NOT shower/wash with your normal soap after using and rinsing off  the CHG Soap.             9.  Pat yourself dry with a clean towel.            10.  Wear clean pajamas.            11.  Place clean sheets on your bed the night of your first shower and do not  sleep with pets. Day of Surgery : Do not apply any lotions/deodorants the morning of surgery.  Please wear clean clothes to the hospital/surgery center.  FAILURE TO FOLLOW THESE INSTRUCTIONS MAY RESULT IN THE CANCELLATION OF YOUR SURGERY PATIENT SIGNATURE_________________________________  NURSE SIGNATURE__________________________________  ________________________________________________________________________

## 2018-08-13 ENCOUNTER — Encounter (HOSPITAL_COMMUNITY): Payer: Self-pay | Admitting: Surgery

## 2018-08-13 DIAGNOSIS — E0510 Thyrotoxicosis with toxic single thyroid nodule without thyrotoxic crisis or storm: Secondary | ICD-10-CM | POA: Diagnosis present

## 2018-08-13 NOTE — H&P (Signed)
General Surgery [facility]  Cristian Nguyen DOB: [birth year] Married / Language: Undefined / Race: White Male  History of Present Illness  The patient is a 63 year old male who presents with a thyroid nodule.  CHIEF COMPLAINT: toxic thyroid nodule  Patient is referred by Dr. Vivia Ewing for surgical evaluation and management of a toxic right thyroid nodule. Patient's primary care physician is Dr. Mickie Hillier. Patient first developed a visible mass in the right neck in the fall of 2019. This has gradually enlarged. He was seen by his primary care physician. Laboratory studies showed a markedly suppressed TSH level. Patient was referred to endocrinology. He underwent an ultrasound examination on May 05, 2018. This showed an enlarged right thyroid lobe measuring 8.0 x 4.7 x 4.1 cm. Left thyroid lobe was normal. Right lobe contained a dominant mass measuring 6.8 cm in greatest dimension. This was complex. No worrisome findings were noted. Patient was started on methimazole. Nuclear scan showed areas of moderate uptake in the right lobe and decreased uptake in the left thyroid lobe. Patient has no prior history of head or neck surgery. There is no family history of thyroid cancer. There is no family history of other endocrine neoplasms. Patient denies any tremors. He denies palpitations. He denies any compressive symptoms. He presents today accompanied by his wife to discuss right thyroid lobectomy for management of toxic right thyroid nodule.   Past Surgical History Colon Polyp Removal - Colonoscopy  Gallbladder Surgery - Laparoscopic  Knee Surgery  Right. Resection of Small Bowel   Diagnostic Studies History Colonoscopy  within last year  Allergies No Known Allergies [08/09/2018]:  Medication History  Esomeprazole Magnesium (40MG  Capsule DR, Oral) Active. Lisinopril (5MG  Tablet, Oral) Active. metFORMIN HCl ER (500MG  Tablet ER  24HR, Oral) Active. methIMAzole (10MG  Tablet, Oral) Active. Simvastatin (20MG  Tablet, Oral) Active. Medications Reconciled  Social History  Alcohol use  Occasional alcohol use. Caffeine use  Carbonated beverages. Illicit drug use  Prefer to discuss with provider. Tobacco use  Former smoker.  Family History Cancer  Brother, Father. Melanoma  Brother.  Other Problems Diabetes Mellitus  Gastroesophageal Reflux Disease  Hepatitis  High blood pressure  Hypercholesterolemia   Review of Systems General Present- Fatigue. Not Present- Appetite Loss, Chills, Fever, Night Sweats, Weight Gain and Weight Loss. Skin Present- Rash. Not Present- Change in Wart/Mole, Dryness, Hives, Jaundice, New Lesions, Non-Healing Wounds and Ulcer. HEENT Present- Ringing in the Ears. Not Present- Earache, Hearing Loss, Hoarseness, Nose Bleed, Oral Ulcers, Seasonal Allergies, Sinus Pain, Sore Throat, Visual Disturbances, Wears glasses/contact lenses and Yellow Eyes. Respiratory Present- Snoring. Not Present- Bloody sputum, Chronic Cough, Difficulty Breathing and Wheezing. Breast Not Present- Breast Mass, Breast Pain, Nipple Discharge and Skin Changes. Cardiovascular Present- Palpitations. Not Present- Chest Pain, Difficulty Breathing Lying Down, Leg Cramps, Rapid Heart Rate, Shortness of Breath and Swelling of Extremities. Gastrointestinal Not Present- Abdominal Pain, Bloating, Bloody Stool, Change in Bowel Habits, Chronic diarrhea, Constipation, Difficulty Swallowing, Excessive gas, Gets full quickly at meals, Hemorrhoids, Indigestion, Nausea, Rectal Pain and Vomiting. Male Genitourinary Present- Nocturia. Not Present- Blood in Urine, Change in Urinary Stream, Frequency, Impotence, Painful Urination, Urgency and Urine Leakage. Musculoskeletal Present- Joint Pain. Not Present- Back Pain, Joint Stiffness, Muscle Pain, Muscle Weakness and Swelling of Extremities. Neurological Not Present- Decreased  Memory, Fainting, Headaches, Numbness, Seizures, Tingling, Tremor, Trouble walking and Weakness. Psychiatric Not Present- Anxiety, Bipolar, Change in Sleep Pattern, Depression, Fearful and Frequent crying. Endocrine Present- New Diabetes. Not Present-  Cold Intolerance, Excessive Hunger, Hair Changes, Heat Intolerance and Hot flashes. Hematology Not Present- Blood Thinners, Easy Bruising, Excessive bleeding, Gland problems, HIV and Persistent Infections.  Vitals Weight: 264.2 lb Height: 74in Body Surface Area: 2.45 m Body Mass Index: 33.92 kg/m  Temp.: 97.75F(Temporal)  Pulse: 87 (Regular)  BP: 138/82 (Sitting, Right Arm, Standard)  Physical Exam  See vital signs recorded above  GENERAL APPEARANCE Development: normal Nutritional status: normal Gross deformities: none  SKIN Rash, lesions, ulcers: none Induration, erythema: none Nodules: none palpable  EYES Conjunctiva and lids: normal Pupils: equal and reactive Iris: normal bilaterally  EARS, NOSE, MOUTH, THROAT External ears: no lesion or deformity External nose: no lesion or deformity Hearing: grossly normal Lips: no lesion or deformity Dentition: normal for age Oral mucosa: moist  NECK Symmetric: no Trachea: midline Thyroid: Visible mass right thyroid bed. Palpable large smooth rounded mass measuring at least 7 or 8 cm in greatest dimension, mobile, nontender. Left thyroid lobe without palpable abnormality. No evidence of lymphadenopathy.  CHEST Respiratory effort: normal Retraction or accessory muscle use: no Breath sounds: normal bilaterally Rales, rhonchi, wheeze: none  CARDIOVASCULAR Auscultation: regular rhythm, normal rate Murmurs: none Pulses: carotid and radial pulse 2+ palpable Lower extremity edema: none Lower extremity varicosities: none  MUSCULOSKELETAL Station and gait: normal Digits and nails: no clubbing or cyanosis Muscle strength: grossly normal all extremities Range of  motion: grossly normal all extremities Deformity: none  LYMPHATIC Cervical: none palpable Supraclavicular: none palpable  PSYCHIATRIC Oriented to person, place, and time: yes Mood and affect: normal for situation Judgment and insight: appropriate for situation    Assessment & Plan  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  Patient is referred by his endocrinologist for evaluation for surgical management of toxic thyroid nodule. He is accompanied by his wife. They're provided with written literature on thyroid surgery to review at home.  Patient has a dominant mass in the right thyroid lobe which is causing hyperthyroidism. There are no worrisome features of this neoplasm. Left lobe appears normal. We have discussed right thyroid lobectomy for definitive management and diagnosis. We discussed the risk and benefits of the surgery including the risks to recurrent laryngeal nerve and to parathyroid glands. We discussed the location of the surgical incision. We discussed the hospital course to be anticipated. We discussed the potential need for additional thyroid surgery. We discussed his postoperative recovery. We discussed the potential need for lifelong thyroid hormone replacement. Patient understands and wishes to proceed with surgery in the near future. We will make arrangements at a time convenient for the patient.  Patient should remain on methimazole until the time of surgery. Patient is currently not taking a beta blocker.  The risks and benefits of the procedure have been discussed at length with the patient. The patient understands the proposed procedure, potential alternative treatments, and the course of recovery to be expected. All of the patient's questions have been answered at this time. The patient wishes to proceed with  surgery.  Armandina Gemma, [facility] Office: [phone]

## 2018-08-14 ENCOUNTER — Other Ambulatory Visit: Payer: Self-pay

## 2018-08-14 ENCOUNTER — Ambulatory Visit (HOSPITAL_COMMUNITY)
Admission: RE | Admit: 2018-08-14 | Discharge: 2018-08-14 | Disposition: A | Discharge status: Home / Self Care | Payer: 59 | Source: Ambulatory Visit | Attending: Anesthesiology | Admitting: Anesthesiology

## 2018-08-14 ENCOUNTER — Encounter (HOSPITAL_COMMUNITY)
Admission: RE | Admit: 2018-08-14 | Discharge: 2018-08-14 | Disposition: A | Discharge status: Home / Self Care | Payer: 59 | Source: Ambulatory Visit | Attending: Surgery | Admitting: Surgery

## 2018-08-14 ENCOUNTER — Encounter (HOSPITAL_COMMUNITY): Payer: Self-pay

## 2018-08-14 DIAGNOSIS — R05 Cough: Secondary | ICD-10-CM | POA: Diagnosis not present

## 2018-08-14 DIAGNOSIS — Z01818 Encounter for other preprocedural examination: Secondary | ICD-10-CM | POA: Insufficient documentation

## 2018-08-14 HISTORY — DX: Nocturia: R35.1

## 2018-08-14 HISTORY — DX: Type 2 diabetes mellitus without complications: E11.9

## 2018-08-14 HISTORY — DX: Chronic obstructive pulmonary disease, unspecified: J44.9

## 2018-08-14 HISTORY — DX: Thyrotoxicosis with toxic single thyroid nodule without thyrotoxic crisis or storm: E05.10

## 2018-08-14 HISTORY — DX: Unspecified osteoarthritis, unspecified site: M19.90

## 2018-08-14 HISTORY — DX: Presence of spectacles and contact lenses: Z97.3

## 2018-08-14 HISTORY — DX: Other specified postprocedural states: Z98.890

## 2018-08-14 HISTORY — DX: Thyrotoxicosis, unspecified without thyrotoxic crisis or storm: E05.90

## 2018-08-14 HISTORY — DX: Personal history of other malignant neoplasm of skin: Z85.828

## 2018-08-14 LAB — CBC
HCT: 45.2 % (ref 39.0–52.0)
Hemoglobin: 15.1 g/dL (ref 13.0–17.0)
MCH: 29.5 pg (ref 26.0–34.0)
MCHC: 33.4 g/dL (ref 30.0–36.0)
MCV: 88.5 fL (ref 80.0–100.0)
Platelets: 201 10*3/uL (ref 150–400)
RBC: 5.11 MIL/uL (ref 4.22–5.81)
RDW: 12.7 % (ref 11.5–15.5)
WBC: 7.6 10*3/uL (ref 4.0–10.5)
nRBC: 0 % (ref 0.0–0.2)

## 2018-08-14 LAB — BASIC METABOLIC PANEL
Anion gap: 8 (ref 5–15)
BUN: 14 mg/dL (ref 8–23)
CO2: 24 mmol/L (ref 22–32)
Calcium: 8.7 mg/dL — ABNORMAL LOW (ref 8.9–10.3)
Chloride: 104 mmol/L (ref 98–111)
Creatinine, Ser: 0.87 mg/dL (ref 0.61–1.24)
GFR calc Af Amer: >60 mL/min (ref >60)
GFR calc non Af Amer: >60 mL/min (ref >60)
Glucose, Bld: 166 mg/dL — ABNORMAL HIGH (ref 70–99)
Potassium: 4.1 mmol/L (ref 3.5–5.1)
Sodium: 136 mmol/L (ref 135–145)

## 2018-08-14 LAB — GLUCOSE, CAPILLARY: Glucose-Capillary: 162 mg/dL — ABNORMAL HIGH (ref 70–99)

## 2018-08-14 LAB — HEMOGLOBIN A1C
Hgb A1c MFr Bld: 7.3 % — ABNORMAL HIGH (ref 4.8–5.6)
Mean Plasma Glucose: 162.81 mg/dL

## 2018-08-14 MED ORDER — CHLORHEXIDINE GLUCONATE CLOTH 2 % EX PADS
6.0000 | MEDICATED_PAD | Freq: Once | CUTANEOUS | Status: DC
  Filled 2018-08-14: qty 6

## 2018-08-14 NOTE — Progress Notes (Signed)
Final CXR and EKG dated 08-14-2018 pending in epic.  A1c result pending in epic.  Chart taken to short stay.

## 2018-08-15 ENCOUNTER — Observation Stay (HOSPITAL_COMMUNITY)
Admission: RE | Admit: 2018-08-15 | Discharge: 2018-08-16 | Disposition: A | Discharge status: Home / Self Care | Payer: 59 | Attending: Surgery | Admitting: Surgery

## 2018-08-15 ENCOUNTER — Ambulatory Visit (HOSPITAL_COMMUNITY): Payer: 59 | Admitting: Anesthesiology

## 2018-08-15 ENCOUNTER — Encounter (HOSPITAL_COMMUNITY)
Admission: RE | Disposition: A | Discharge status: Home / Self Care | Payer: Self-pay | Source: Home / Self Care | Attending: Surgery

## 2018-08-15 ENCOUNTER — Encounter (HOSPITAL_COMMUNITY): Payer: Self-pay | Admitting: Emergency Medicine

## 2018-08-15 ENCOUNTER — Ambulatory Visit (HOSPITAL_COMMUNITY): Payer: 59 | Admitting: Physician Assistant

## 2018-08-15 DIAGNOSIS — Z7984 Long term (current) use of oral hypoglycemic drugs: Secondary | ICD-10-CM | POA: Diagnosis not present

## 2018-08-15 DIAGNOSIS — K219 Gastro-esophageal reflux disease without esophagitis: Secondary | ICD-10-CM | POA: Diagnosis not present

## 2018-08-15 DIAGNOSIS — Z79899 Other long term (current) drug therapy: Secondary | ICD-10-CM | POA: Diagnosis not present

## 2018-08-15 DIAGNOSIS — E0510 Thyrotoxicosis with toxic single thyroid nodule without thyrotoxic crisis or storm: Secondary | ICD-10-CM | POA: Diagnosis not present

## 2018-08-15 DIAGNOSIS — Z87891 Personal history of nicotine dependence: Secondary | ICD-10-CM | POA: Diagnosis not present

## 2018-08-15 DIAGNOSIS — E119 Type 2 diabetes mellitus without complications: Secondary | ICD-10-CM | POA: Diagnosis not present

## 2018-08-15 DIAGNOSIS — E7800 Pure hypercholesterolemia, unspecified: Secondary | ICD-10-CM | POA: Diagnosis not present

## 2018-08-15 DIAGNOSIS — J449 Chronic obstructive pulmonary disease, unspecified: Secondary | ICD-10-CM | POA: Diagnosis not present

## 2018-08-15 DIAGNOSIS — E0520 Thyrotoxicosis with toxic multinodular goiter without thyrotoxic crisis or storm: Secondary | ICD-10-CM | POA: Diagnosis not present

## 2018-08-15 DIAGNOSIS — E041 Nontoxic single thyroid nodule: Secondary | ICD-10-CM | POA: Diagnosis not present

## 2018-08-15 DIAGNOSIS — I10 Essential (primary) hypertension: Secondary | ICD-10-CM | POA: Diagnosis not present

## 2018-08-15 HISTORY — PX: THYROID LOBECTOMY: SHX420

## 2018-08-15 LAB — GLUCOSE, CAPILLARY
Glucose-Capillary: 130 mg/dL — ABNORMAL HIGH (ref 70–99)
Glucose-Capillary: 166 mg/dL — ABNORMAL HIGH (ref 70–99)
Glucose-Capillary: 229 mg/dL — ABNORMAL HIGH (ref 70–99)

## 2018-08-15 SURGERY — LOBECTOMY, THYROID
Anesthesia: General | Laterality: Right

## 2018-08-15 MED ORDER — DEXAMETHASONE SODIUM PHOSPHATE 10 MG/ML IJ SOLN
INTRAMUSCULAR | Status: DC | PRN
  Administered 2018-08-15: 4 mg via INTRAVENOUS

## 2018-08-15 MED ORDER — ONDANSETRON 4 MG PO TBDP: 4.0000 mg | ORAL_TABLET | Freq: Four times a day (QID) | ORAL | Status: DC | PRN

## 2018-08-15 MED ORDER — HYDROMORPHONE HCL 1 MG/ML IJ SOLN
1.0000 mg | INTRAMUSCULAR | Status: DC | PRN
  Administered 2018-08-15: 1 mg via INTRAVENOUS
  Filled 2018-08-15: qty 1

## 2018-08-15 MED ORDER — ROCURONIUM BROMIDE 100 MG/10ML IV SOLN
INTRAVENOUS | Status: DC | PRN
  Administered 2018-08-15: 10 mg via INTRAVENOUS
  Administered 2018-08-15: 50 mg via INTRAVENOUS
  Administered 2018-08-15 (×2): 10 mg via INTRAVENOUS

## 2018-08-15 MED ORDER — METFORMIN HCL ER 500 MG PO TB24
1000.0000 mg | ORAL_TABLET | Freq: Two times a day (BID) | ORAL | Status: DC
  Administered 2018-08-15 – 2018-08-16 (×2): 1000 mg via ORAL
  Filled 2018-08-15 (×2): qty 2

## 2018-08-15 MED ORDER — EPHEDRINE SULFATE 50 MG/ML IJ SOLN
INTRAMUSCULAR | Status: DC | PRN
  Administered 2018-08-15: 7 mg via INTRAVENOUS

## 2018-08-15 MED ORDER — FENTANYL CITRATE (PF) 100 MCG/2ML IJ SOLN
25.0000 ug | INTRAMUSCULAR | Status: DC | PRN
  Administered 2018-08-15 (×3): 50 ug via INTRAVENOUS

## 2018-08-15 MED ORDER — CEFAZOLIN SODIUM-DEXTROSE 2-4 GM/100ML-% IV SOLN
2.0000 g | INTRAVENOUS | Status: AC
  Administered 2018-08-15: 2 g via INTRAVENOUS
  Filled 2018-08-15: qty 100

## 2018-08-15 MED ORDER — 0.9 % SODIUM CHLORIDE (POUR BTL) OPTIME
TOPICAL | Status: DC | PRN
  Administered 2018-08-15: 1000 mL

## 2018-08-15 MED ORDER — FENTANYL CITRATE (PF) 250 MCG/5ML IJ SOLN
INTRAMUSCULAR | Status: AC
  Filled 2018-08-15: qty 5

## 2018-08-15 MED ORDER — OXYCODONE HCL 5 MG/5ML PO SOLN
5.0000 mg | Freq: Once | ORAL | Status: DC | PRN
  Filled 2018-08-15: qty 5

## 2018-08-15 MED ORDER — PROPOFOL 10 MG/ML IV BOLUS
INTRAVENOUS | Status: DC | PRN
  Administered 2018-08-15: 200 mg via INTRAVENOUS

## 2018-08-15 MED ORDER — LISINOPRIL 5 MG PO TABS
5.0000 mg | ORAL_TABLET | Freq: Every day | ORAL | Status: DC
  Administered 2018-08-15: 5 mg via ORAL
  Filled 2018-08-15: qty 1

## 2018-08-15 MED ORDER — ACETAMINOPHEN 325 MG PO TABS
650.0000 mg | ORAL_TABLET | Freq: Four times a day (QID) | ORAL | Status: DC | PRN
  Administered 2018-08-15 – 2018-08-16 (×3): 650 mg via ORAL
  Filled 2018-08-15 (×3): qty 2

## 2018-08-15 MED ORDER — EPHEDRINE 5 MG/ML INJ
INTRAVENOUS | Status: AC
  Filled 2018-08-15: qty 10

## 2018-08-15 MED ORDER — SUGAMMADEX SODIUM 500 MG/5ML IV SOLN
INTRAVENOUS | Status: AC
  Filled 2018-08-15: qty 5

## 2018-08-15 MED ORDER — ROCURONIUM BROMIDE 100 MG/10ML IV SOLN
INTRAVENOUS | Status: AC
  Filled 2018-08-15: qty 1

## 2018-08-15 MED ORDER — MIDAZOLAM HCL 5 MG/5ML IJ SOLN
INTRAMUSCULAR | Status: DC | PRN
  Administered 2018-08-15 (×2): 1 mg via INTRAVENOUS

## 2018-08-15 MED ORDER — ONDANSETRON HCL 4 MG/2ML IJ SOLN: 4.0000 mg | Freq: Four times a day (QID) | INTRAMUSCULAR | Status: DC | PRN

## 2018-08-15 MED ORDER — LACTATED RINGERS IV SOLN
INTRAVENOUS | Status: DC
  Administered 2018-08-15: 06:00:00 via INTRAVENOUS

## 2018-08-15 MED ORDER — FENTANYL CITRATE (PF) 100 MCG/2ML IJ SOLN
INTRAMUSCULAR | Status: DC | PRN
  Administered 2018-08-15 (×6): 50 ug via INTRAVENOUS
  Administered 2018-08-15: 100 ug via INTRAVENOUS
  Administered 2018-08-15 (×2): 50 ug via INTRAVENOUS

## 2018-08-15 MED ORDER — HYDROCODONE-ACETAMINOPHEN 5-325 MG PO TABS
1.0000 | ORAL_TABLET | ORAL | Status: DC | PRN
  Administered 2018-08-15: 2 via ORAL
  Filled 2018-08-15: qty 2

## 2018-08-15 MED ORDER — TRAMADOL HCL 50 MG PO TABS: 50.0000 mg | ORAL_TABLET | Freq: Four times a day (QID) | ORAL | Status: DC | PRN

## 2018-08-15 MED ORDER — ACETAMINOPHEN 650 MG RE SUPP: 650.0000 mg | Freq: Four times a day (QID) | RECTAL | Status: DC | PRN

## 2018-08-15 MED ORDER — MIDAZOLAM HCL 2 MG/2ML IJ SOLN
INTRAMUSCULAR | Status: AC
  Filled 2018-08-15: qty 2

## 2018-08-15 MED ORDER — PANTOPRAZOLE SODIUM 40 MG PO TBEC: 40.0000 mg | DELAYED_RELEASE_TABLET | Freq: Every day | ORAL | Status: DC

## 2018-08-15 MED ORDER — PROPOFOL 10 MG/ML IV BOLUS
INTRAVENOUS | Status: AC
  Filled 2018-08-15: qty 20

## 2018-08-15 MED ORDER — SUGAMMADEX SODIUM 500 MG/5ML IV SOLN
INTRAVENOUS | Status: DC | PRN
  Administered 2018-08-15: 300 mg via INTRAVENOUS

## 2018-08-15 MED ORDER — DEXAMETHASONE SODIUM PHOSPHATE 10 MG/ML IJ SOLN
INTRAMUSCULAR | Status: AC
  Filled 2018-08-15: qty 1

## 2018-08-15 MED ORDER — POTASSIUM CHLORIDE IN NACL 20-0.45 MEQ/L-% IV SOLN
INTRAVENOUS | Status: DC
  Administered 2018-08-15 – 2018-08-16 (×2): via INTRAVENOUS
  Filled 2018-08-15 (×3): qty 1000

## 2018-08-15 MED ORDER — FENTANYL CITRATE (PF) 100 MCG/2ML IJ SOLN
INTRAMUSCULAR | Status: AC
  Administered 2018-08-15: 50 ug via INTRAVENOUS
  Filled 2018-08-15: qty 4

## 2018-08-15 MED ORDER — OXYCODONE HCL 5 MG PO TABS: 5.0000 mg | ORAL_TABLET | Freq: Once | ORAL | Status: DC | PRN

## 2018-08-15 MED ORDER — LIDOCAINE HCL (CARDIAC) PF 100 MG/5ML IV SOSY
PREFILLED_SYRINGE | INTRAVENOUS | Status: DC | PRN
  Administered 2018-08-15: 30 mg via INTRAVENOUS

## 2018-08-15 MED ORDER — ONDANSETRON HCL 4 MG/2ML IJ SOLN
INTRAMUSCULAR | Status: AC
  Filled 2018-08-15: qty 2

## 2018-08-15 MED ORDER — ONDANSETRON HCL 4 MG/2ML IJ SOLN
INTRAMUSCULAR | Status: DC | PRN
  Administered 2018-08-15: 4 mg via INTRAVENOUS

## 2018-08-15 SURGICAL SUPPLY — 38 items
ADH SKN CLS APL DERMABOND .7 (GAUZE/BANDAGES/DRESSINGS) ×1
ATTRACTOMAT 16X20 MAGNETIC DRP (DRAPES) ×2 IMPLANT
BLADE SURG 15 STRL LF DISP TIS (BLADE) ×1 IMPLANT
BLADE SURG 15 STRL SS (BLADE) ×1
CHLORAPREP W/TINT 26ML (MISCELLANEOUS) ×4 IMPLANT
CLIP VESOCCLUDE MED 6/CT (CLIP) ×4 IMPLANT
CLIP VESOCCLUDE SM WIDE 6/CT (CLIP) ×4 IMPLANT
COVER SURGICAL LIGHT HANDLE (MISCELLANEOUS) ×2 IMPLANT
COVER WAND RF STERILE (DRAPES) ×2 IMPLANT
DERMABOND ADVANCED (GAUZE/BANDAGES/DRESSINGS) ×1
DERMABOND ADVANCED .7 DNX12 (GAUZE/BANDAGES/DRESSINGS) ×1 IMPLANT
DRAPE LAPAROTOMY T 98X78 PEDS (DRAPES) ×2 IMPLANT
ELECT PENCIL ROCKER SW 15FT (MISCELLANEOUS) ×2 IMPLANT
ELECT REM PT RETURN 15FT ADLT (MISCELLANEOUS) ×2 IMPLANT
GAUZE 4X4 16PLY RFD (DISPOSABLE) ×2 IMPLANT
GLOVE BIOGEL PI IND STRL 6.5 (GLOVE) ×1 IMPLANT
GLOVE BIOGEL PI IND STRL 7.0 (GLOVE) ×2 IMPLANT
GLOVE BIOGEL PI IND STRL 7.5 (GLOVE) ×2 IMPLANT
GLOVE BIOGEL PI INDICATOR 6.5 (GLOVE) ×1
GLOVE BIOGEL PI INDICATOR 7.0 (GLOVE) ×2
GLOVE BIOGEL PI INDICATOR 7.5 (GLOVE) ×2
GLOVE ECLIPSE 6.5 STRL STRAW (GLOVE) ×2 IMPLANT
GLOVE SURG ORTHO 8.0 STRL STRW (GLOVE) ×2 IMPLANT
GLOVE SURG SS PI 7.0 STRL IVOR (GLOVE) ×2 IMPLANT
GOWN STRL REUS W/ TWL LRG LVL3 (GOWN DISPOSABLE) ×1 IMPLANT
GOWN STRL REUS W/TWL LRG LVL3 (GOWN DISPOSABLE) ×2
GOWN STRL REUS W/TWL XL LVL3 (GOWN DISPOSABLE) ×6 IMPLANT
HEMOSTAT SURGICEL 2X4 FIBR (HEMOSTASIS) ×2 IMPLANT
ILLUMINATOR WAVEGUIDE N/F (MISCELLANEOUS) IMPLANT
KIT BASIN OR (CUSTOM PROCEDURE TRAY) ×2 IMPLANT
PACK BASIC VI WITH GOWN DISP (CUSTOM PROCEDURE TRAY) ×2 IMPLANT
SHEARS HARMONIC 9CM CVD (BLADE) ×2 IMPLANT
SUT MNCRL AB 4-0 PS2 18 (SUTURE) ×2 IMPLANT
SUT VIC AB 3-0 SH 18 (SUTURE) ×4 IMPLANT
SYR BULB IRRIGATION 50ML (SYRINGE) ×2 IMPLANT
TOWEL OR 17X26 10 PK STRL BLUE (TOWEL DISPOSABLE) ×2 IMPLANT
TOWEL OR NON WOVEN STRL DISP B (DISPOSABLE) ×2 IMPLANT
TUBING CONNECTING 10 (TUBING) ×2 IMPLANT

## 2018-08-15 NOTE — Op Note (Signed)
Procedure Note  Pre-operative Diagnosis:  Toxic right thyroid nodule  Post-operative Diagnosis:  same  Surgeon:  Armandina Gemma, MD  Assistant:  none   Procedure:  Right thyroid lobectomy and isthmusectomy  Anesthesia:  General  Estimated Blood Loss:  minimal  Drains: none         Specimen: thyroid lobe to pathology  Indications:  Patient is referred by Dr. Vivia Ewing for surgical evaluation and management of a toxic right thyroid nodule. Patient's primary care physician is Dr. Mickie Hillier. Patient first developed a visible mass in the right neck in the fall of 2019. This has gradually enlarged. He was seen by his primary care physician. Laboratory studies showed a markedly suppressed TSH level. Patient was referred to endocrinology. He underwent an ultrasound examination on May 05, 2018. This showed an enlarged right thyroid lobe measuring 8.0 x 4.7 x 4.1 cm. Left thyroid lobe was normal. Right lobe contained a dominant mass measuring 6.8 cm in greatest dimension. This was complex. No worrisome findings were noted. Patient was started on methimazole. Nuclear scan showed areas of moderate uptake in the right lobe and decreased uptake in the left thyroid lobe. Patient has no prior history of head or neck surgery. There is no family history of thyroid cancer. There is no family history of other endocrine neoplasms. Patient denies any tremors. He denies palpitations. He denies any compressive symptoms. He presents today accompanied by his wife to discuss right thyroid lobectomy for management of toxic right thyroid nodule.  Procedure Details: Procedure was done in OR #1 at the [facility]. The patient was brought to the operating room and placed in a supine position on the operating room table. Following administration of general anesthesia, the patient was positioned and then prepped and draped in the usual aseptic fashion. After ascertaining that an adequate  level of anesthesia had been achieved, a small Kocher incision was made with #15 blade. Dissection was carried through subcutaneous tissues and platysma. Hemostasis was achieved with the electrocautery. Skin flaps were elevated cephalad and caudad from the thyroid notch to the sternal notch. A self-retaining retractor was placed for exposure. Strap muscles were incised in the midline and dissection was begun on the right side. Strap muscles were reflected laterally. The right thyroid lobe was markedly enlarged by a dominant centrally located complex mass. The lobe was gently mobilized with blunt dissection. Superior pole vessels were dissected out and divided individually between small and medium ligaclips with the harmonic scalpel. The thyroid lobe was rolled anteriorly. Branches of the inferior thyroid artery were divided between small ligaclips with the harmonic scalpel. Inferior venous tributaries were divided between ligaclips. Both the superior and inferior parathyroid glands were identified and preserved on their vascular pedicles. The recurrent laryngeal nerve was identified and preserved along its course. The ligament of Gwenlyn Found was released with the electrocautery and the gland was mobilized onto the anterior trachea. Isthmus was mobilized across the midline. There was moderate sized pyramidal lobe present was resected en bloc with the isthmus. The thyroid parenchyma was transected at the junction of the isthmus and contralateral thyroid lobe with the harmonic scalpel. The thyroid lobe and isthmus were submitted to pathology for review.  The neck was irrigated with warm saline. Fibrillar was placed throughout the operative field. Strap muscles were approximated in the midline with interrupted 3-0 Vicryl sutures. Platysma was closed with interrupted 3-0 Vicryl sutures. Skin was closed with a running 4-0 Monocryl subcuticular suture.  Wound was washed and  dried and Dermabond was applied. The patient was  awakened from anesthesia and brought to the recovery room. The patient tolerated the procedure well.   Armandina Gemma, MD [facility] Office: [phone]

## 2018-08-15 NOTE — Anesthesia Preprocedure Evaluation (Signed)
Anesthesia Evaluation  Patient identified by MRN, date of birth, ID band Patient awake    Reviewed: Allergy & Precautions, H&P , NPO status , Patient's Chart, lab work & pertinent test results  Airway Mallampati: II   Neck ROM: full    Dental   Pulmonary COPD, former smoker,    breath sounds clear to auscultation       Cardiovascular hypertension,  Rhythm:regular Rate:Normal     Neuro/Psych    GI/Hepatic GERD  ,  Endo/Other  diabetes, Type 2Hyperthyroidism obese  Renal/GU      Musculoskeletal  (+) Arthritis ,   Abdominal   Peds  Hematology   Anesthesia Other Findings   Reproductive/Obstetrics                             Anesthesia Physical Anesthesia Plan  ASA: II  Anesthesia Plan: General   Post-op Pain Management:    Induction: Intravenous  PONV Risk Score and Plan: 2 and Ondansetron, Dexamethasone, Midazolam and Treatment may vary due to age or medical condition  Airway Management Planned: Oral ETT  Additional Equipment:   Intra-op Plan:   Post-operative Plan: Extubation in OR  Informed Consent: I have reviewed the patients History and Physical, chart, labs and discussed the procedure including the risks, benefits and alternatives for the proposed anesthesia with the patient or authorized representative who has indicated his/her understanding and acceptance.       Plan Discussed with: CRNA, Anesthesiologist and Surgeon  Anesthesia Plan Comments:         Anesthesia Quick Evaluation

## 2018-08-15 NOTE — Anesthesia Postprocedure Evaluation (Signed)
Anesthesia Post Note  Patient: Cristian Nguyen  Procedure(s) Performed: RIGHT THYROID LOBECTOMY (Right )     Patient location during evaluation: PACU Anesthesia Type: General Level of consciousness: awake and alert Pain management: pain level controlled Vital Signs Assessment: post-procedure vital signs reviewed and stable Respiratory status: spontaneous breathing, nonlabored ventilation, respiratory function stable and patient connected to nasal cannula oxygen Cardiovascular status: blood pressure returned to baseline and stable Postop Assessment: no apparent nausea or vomiting Anesthetic complications: no    Last Vitals:  Vitals:   08/15/18 1036 08/15/18 1137  BP: (!) 172/101 (!) 163/98  Pulse: 91 83  Resp: 16 18  Temp: 36.7 C 36.7 C  SpO2: 97% 95%    Last Pain:  Vitals:   08/15/18 1211  TempSrc:   PainSc: [place]

## 2018-08-15 NOTE — Anesthesia Procedure Notes (Signed)
Procedure Name: Intubation Date/Time: 08/15/2018 7:34 AM Performed by: Garrel Ridgel, CRNA Pre-anesthesia Checklist: Patient identified, Emergency Drugs available, Suction available, Patient being monitored and Timeout performed Patient Re-evaluated:Patient Re-evaluated prior to induction Oxygen Delivery Method: Circle system utilized Preoxygenation: Pre-oxygenation with 100% oxygen Induction Type: IV induction Ventilation: Mask ventilation without difficulty Grade View: Grade III Tube type: Oral Tube size: 7.5 mm Number of attempts: 2 Airway Equipment and Method: Video-laryngoscopy Secured at: 23 cm Tube secured with: Tape Dental Injury: Teeth and Oropharynx as per pre-operative assessment

## 2018-08-15 NOTE — Transfer of Care (Signed)
Immediate Anesthesia Transfer of Care Note  Patient: Cristian Nguyen  Procedure(s) Performed: RIGHT THYROID LOBECTOMY (Right )  Patient Location: PACU  Anesthesia Type:General  Level of Consciousness: awake, alert  and oriented  Airway & Oxygen Therapy: Patient Spontanous Breathing and Patient connected to face mask oxygen  Post-op Assessment: Report given to RN, Post -op Vital signs reviewed and stable, Patient moving all extremities and Patient able to stick tongue midline  Post vital signs: Reviewed and stable  Last Vitals:  Vitals Value Taken Time  BP 167/92 08/15/2018  9:34 AM  Temp    Pulse 98 08/15/2018  9:35 AM  Resp 12 08/15/2018  9:35 AM  SpO2 98 % 08/15/2018  9:35 AM  Vitals shown include unvalidated device data.  Last Pain:  Vitals:   08/15/18 0557  TempSrc:   PainSc: 0-No pain      Patients Stated Pain Goal: 4 (49/82/64 1583)  Complications: No apparent anesthesia complications

## 2018-08-15 NOTE — Interval H&P Note (Signed)
History and Physical Interval Note:  08/15/2018 7:10 AM  Cristian Nguyen  has presented today for surgery, with the diagnosis of toxic thyroid nodule.  The various methods of treatment have been discussed with the patient and family. After consideration of risks, benefits and other options for treatment, the patient has consented to    Procedure(s): RIGHT THYROID LOBECTOMY (Right) as a surgical intervention .    The patient's history has been reviewed, patient examined, no change in status, stable for surgery.  I have reviewed the patient's chart and labs.  Questions were answered to the patient's satisfaction.    Armandina Gemma, [facility] Office: [place]

## 2018-08-16 ENCOUNTER — Encounter (HOSPITAL_COMMUNITY): Payer: Self-pay | Admitting: Surgery

## 2018-08-16 DIAGNOSIS — E0510 Thyrotoxicosis with toxic single thyroid nodule without thyrotoxic crisis or storm: Secondary | ICD-10-CM | POA: Diagnosis not present

## 2018-08-16 NOTE — Discharge Summary (Signed)
Physician Discharge Summary [facility]  Patient ID: Cristian Nguyen MRN: [id number] DOB/AGE: 62/26/1957 62 y.o.  Admit date: 08/15/2018 Discharge date: 08/16/2018  Admission Diagnoses:  Toxic thyroid nodule  Discharge Diagnoses:  Principal Problem:   Thyrotoxicosis with toxic single thyroid nodule without thyrotoxic crisis or storm Active Problems:   Toxic thyroid nodule   Discharged Condition: good  Hospital Course: Patient was admitted for observation following thyroid surgery.  Post op course was uncomplicated.  Pain was well controlled.  Tolerated diet.  Patient was prepared for discharge home on POD#1.  Consults: None  Treatments: surgery: right thyroid lobectomy and isthmusectomy  Discharge Exam: Blood pressure 138/74, pulse 80, temperature 98.1 F (36.7 C), temperature source Oral, resp. rate 18, height 6\' 1"  (1.854 m), weight 119.4 kg, SpO2 95 %. HEENT - clear Neck - wound dry and intact; minimal STS; voice normal; Dermabond on wound Chest - clear bilaterally Cor - RRR  Disposition: Home  Discharge Instructions    Diet - low sodium heart healthy   Complete by:  As directed    Discharge instructions   Complete by:  As directed    [facility]  THYROID & PARATHYROID SURGERY:  POST-OP INSTRUCTIONS  Always review your discharge instruction sheet from the facility where your surgery was performed.  A prescription for pain medication may be given to you upon discharge.  Take your pain medication as prescribed.  If narcotic pain medicine is not needed, then you may take acetaminophen (Tylenol) or ibuprofen (Advil) as needed.  Take your usually prescribed medications unless otherwise directed.  If you need a refill on your pain medication, please contact our office during regular business hours.  Prescriptions cannot be processed by our office after 5 pm or on weekends.  Start with a light diet upon arrival home, such as  soup and crackers or toast.  Be sure to drink plenty of fluids daily.  Resume your normal diet the day after surgery.  Most patients will experience some swelling and bruising on the chest and neck area.  Ice packs will help.  Swelling and bruising can take several days to resolve.   It is common to experience some constipation after surgery.  Increasing fluid intake and taking a stool softener (Colace) will usually help or prevent this problem.  A mild laxative (Milk of Magnesia or Miralax) should be taken according to package directions if there has been no bowel movement after 48 hours.  You have steri-strips and a gauze dressing over your incision.  You may remove the gauze bandage on the second day after surgery, and you may shower at that time.  Leave your steri-strips (small skin tapes) in place directly over the incision.  These strips should remain on the skin for 5-7 days and then be removed.  You may get them wet in the shower and pat them dry.  You may resume regular (light) daily activities beginning the next day (such as daily self-care, walking, climbing stairs) gradually increasing activities as tolerated.  You may have sexual intercourse when it is comfortable.  Refrain from any heavy lifting or straining until approved by your doctor.  You may drive when you no longer are taking prescription pain medication, you can comfortably wear a seatbelt, and you can safely maneuver your car and apply brakes.  You should see your doctor in the office for a follow-up appointment approximately three weeks after your surgery.  Make sure that you  call for this appointment within a day or two after you arrive home to insure a convenient appointment time.  WHEN TO CALL YOUR DOCTOR: -- Fever greater than 101.5 -- Inability to urinate -- Nausea and/or vomiting - persistent -- Extreme swelling or bruising -- Continued bleeding from incision -- Increased pain, redness, or drainage from the  incision -- Difficulty swallowing or breathing -- Muscle cramping or spasms -- Numbness or tingling in hands or around lips  The clinic staff is available to answer your questions during regular business hours.  Please don't hesitate to call and ask to speak to one of the nurses if you have concerns.  Armandina Gemma, MD [facility] Office: [phone]   Increase activity slowly   Complete by:  As directed    No dressing needed   Complete by:  As directed      Allergies as of 08/16/2018   No Known Allergies     Medication List    TAKE these medications   esomeprazole 40 MG capsule Commonly known as:  NEXIUM TAKE ONE CAPSULE BY MOUTH ONCE DAILY BEFORE BREAKFAST What changed:    how much to take  how to take this  when to take this   lisinopril 5 MG tablet Commonly known as:  PRINIVIL,ZESTRIL One po QHS. What changed:    how much to take  how to take this  when to take this   metFORMIN 500 MG 24 hr tablet Commonly known as:  GLUCOPHAGE XR Take four tablets Q am . What changed:    how much to take  how to take this  when to take this   simvastatin 20 MG tablet Commonly known as:  ZOCOR Take 1 tablet (20 mg total) by mouth daily with breakfast.      Follow-up Information    Armandina Gemma, MD. Schedule an appointment as soon as possible for a visit in 3 week(s).   Specialty:  General Surgery Contact information: [address] [place] [address] [phone]           Earnstine Regal, MD, [facility] Office: [phone]   Signed: Armandina Gemma 08/16/2018, 7:37 AM

## 2018-08-16 NOTE — Progress Notes (Signed)
Please contact patient and notify of benign pathology results.  Kojo Liby M. Alixander Rallis, MD, FACS [facility] Office: [phone]   

## 2018-08-16 NOTE — Progress Notes (Signed)
Discharge instructions given to pt and all questions were answered. Pt taken down via wheelchair and was picked up by his wife. 

## 2018-09-01 ENCOUNTER — Ambulatory Visit: Payer: 59 | Admitting: Internal Medicine

## 2018-09-01 ENCOUNTER — Other Ambulatory Visit: Payer: Self-pay

## 2018-09-01 ENCOUNTER — Encounter: Payer: Self-pay | Admitting: Internal Medicine

## 2018-09-01 VITALS — BP 132/84 | HR 70 | Ht 73.0 in | Wt 266.4 lb

## 2018-09-01 DIAGNOSIS — E0510 Thyrotoxicosis with toxic single thyroid nodule without thyrotoxic crisis or storm: Secondary | ICD-10-CM | POA: Diagnosis not present

## 2018-09-01 DIAGNOSIS — Z9889 Other specified postprocedural states: Secondary | ICD-10-CM | POA: Diagnosis not present

## 2018-09-01 DIAGNOSIS — E890 Postprocedural hypothyroidism: Secondary | ICD-10-CM | POA: Insufficient documentation

## 2018-09-01 LAB — TSH: TSH: 1.37 u[IU]/mL (ref 0.35–4.50)

## 2018-09-01 LAB — T4, FREE: Free T4: 0.4 ng/dL — ABNORMAL LOW (ref 0.60–1.60)

## 2018-09-01 NOTE — Progress Notes (Signed)
Name: Cristian Nguyen  MRN/ DOB: [id number], 06/26/1956    Age/ Sex: 63 y.o., male     PCP: Mikey Kirschner, MD   Reason for Endocrinology Evaluation: 05/24/2018     Initial Endocrinology Clinic Visit: Hyperthyroidism     PATIENT IDENTIFIER: Cristian Nguyen is a 63 y.o., male with a past medical history of HTN, T2DM and GERD . He has followed with [facility] since 05/24/18 for consultative assistance with management of his hyperthyroidism.   HISTORICAL SUMMARY: The patient presented to his PCP in October, 2019 with c/o neck enlargement, a thyroid ultrasound showed a 6.8 cm right thyroid solid cystic nodule.  His TSH was suppressed at <0.01 uIU/mL with elevated FT4 at 1.81 ng/dL . He was started on Methimazole.  Thyroid uptake and scan on 06/09/2018 shows toxic right adenoma with scattered areas of cold and warm area.   He underwent right subtotal thyroidectomy on 08/15/2018 with benign pathology.     SUBJECTIVE:   During last visit (07/07/2018): He was started on 10 mg of Methimazole due to low TSH and high FT4.  Today (09/01/2018):  Cristian Nguyen is here with his wife for a 6 week follow up on toxic adenoma. He has noted worsening neck enlargement. He feels some discomfort on the right side of his throat especially when he eats. He denies any hyper or hypothyroid symptoms such as weight changes, palpitations, heat intolerance, no diarrhea or constipation.   Has noted some weight gain since the surgery.  He does endorse fatigue , requiring naps, denies constipation and  Cold intolerance.   Denies depression  Denies tingling and numbness of the hands    ROS:  As per HPI.   HISTORY:  Past Medical History:  Past Medical History:  Diagnosis Date  . COPD (chronic obstructive pulmonary disease) ([place])   . GERD (gastroesophageal reflux disease)   . History of basal cell carcinoma (BCC) excision    07-31-2018 x2  cheek area  . Hyperlipidemia   . Hypertension     . Hyperthyroidism    endocrinologist-- dr Crissie Sickles (note in epic)  . Nocturia   . OA (osteoarthritis)   . Thyroid adenoma, toxic    right side  . Type 2 diabetes mellitus ([place])    followed by pcp  . Wears contact lenses    Past Surgical History:  Past Surgical History:  Procedure Laterality Date  . COLONOSCOPY  02/29/2012   Procedure: COLONOSCOPY;  Surgeon: Jamesetta So, MD;  Location: [facility];  Service: Gastroenterology;  Laterality: N/A;  . COLONOSCOPY N/A 01/03/2018   Procedure: COLONOSCOPY;  Surgeon: Aviva Signs, MD;  Location: [facility];  Service: Gastroenterology;  Laterality: N/A;  . KNEE ARTHROSCOPY Right 04-25-2009  dr Aline Brochure @[facility]   . LAPAROSCOPIC CHOLECYSTECTOMY  11-07-2004   dr Arnoldo Morale @[facility]   . THYROID LOBECTOMY Right 08/15/2018   Procedure: RIGHT THYROID LOBECTOMY;  Surgeon: Armandina Gemma, MD;  Location: [facility];  Service: General;  Laterality: Right;    Social History:  reports that he quit smoking about 11 years ago. His smoking use included cigarettes. He quit after 35.00 years of use. He has never used smokeless tobacco. He reports current alcohol use. He reports that he does not use drugs.  Family History: family history includes Cancer in his brother and father; Heart disease in his father; Hypertension in his mother.   HOME MEDICATIONS: Allergies as of 09/01/2018   No Known Allergies     Medication List  Accurate as of September 01, 2018 12:36 PM. Always use your most recent med list.        esomeprazole 40 MG capsule Commonly known as:  NEXIUM TAKE ONE CAPSULE BY MOUTH ONCE DAILY BEFORE BREAKFAST   lisinopril 5 MG tablet Commonly known as:  PRINIVIL,ZESTRIL One po QHS.   metFORMIN 500 MG 24 hr tablet Commonly known as:  GLUCOPHAGE XR Take four tablets Q am .   simvastatin 20 MG tablet Commonly known as:  ZOCOR Take 1 tablet (20 mg total) by mouth daily with breakfast.          OBJECTIVE:   PHYSICAL EXAM: VS: BP 132/84  (BP Location: Left Arm, Patient Position: Sitting, Cuff Size: Normal)   Pulse 70   Ht 6\' 1"  (1.854 m)   Wt 266 lb 6.4 oz (120.8 kg)   SpO2 99%   BMI 35.15 kg/m    EXAM: General: Pt appears well and is in NAD  Neck: General: Supple without adenopathy. Thyroid: Anterior neck surgical incision is clean, no evidence of infection.  Lungs: Clear with good BS bilat with no rales, rhonchi, or wheezes  Heart: Auscultation: RRR.  Abdomen: Normoactive bowel sounds, soft, nontender, without masses or organomegaly palpable  Extremities:  BL LE: No pretibial edema normal ROM and strength.  Mental Status: Judgment, insight: Intact Orientation: Oriented to time, place, and person Mood and affect: No depression, anxiety, or agitation     DATA REVIEWED: Results for Cristian Nguyen, Cristian Nguyen (MRN [id number]) as of 09/01/2018 15:05  Ref. Range 09/01/2018 09:52  TSH Latest Ref Range: 0.35 - 4.50 uIU/mL 1.37  T4,Free(Direct) Latest Ref Range: 0.60 - 1.60 ng/dL 0.40 (L)     Thyroid Ultrasound 05/05/2018  Nodule # 1:  Location: Right; Mid  Maximum size: 6.8 cm; Other 2 dimensions: 5.4 x 4.4 cm  Composition: mixed cystic and solid (1)  Echogenicity: isoechoic (1)  Shape: not taller-than-wide (0)  Margins: smooth (0)  Echogenic foci: none (0)  ACR TI-RADS total points: 2.  ACR TI-RADS risk category: TR2 (2 points).  ACR TI-RADS recommendations:  This nodule does NOT meet TI-RADS criteria for biopsy or dedicated follow-up.  _________________________________________________________  IMPRESSION: Large right complex solid and cystic nodule 1 does not meet criteria for biopsy nor follow-up.   Thyroid pathology report (08/15/2018) Thyroid, lobectomy, right thyroid lobe and isthmus - ADENOMATOUS NODULE WITH CYSTIC, DEGENERATIVE CHANGES. - NO EVIDENCE OF MALIGNANCY.   ASSESSMENT / PLAN / RECOMMENDATIONS:   1. Right Toxic Adenoma , S/P Right subtotal thyroidectomy:   -  Pt is clinically euthyroid  - He feels like he is recovering well from the surgery, denies any complications such as voice hoarseness. -Repeat TFT's today show normalization of the TSH but with low T4. This is not unusual , since his toxic adenoma , has taken over thyroid function and his gland is currently dormant, will check in 3 weeks and see if his gland recovers, if not then he understands , he will need to be on LT-4 replacement.       Follow-up PRN   Addedum: discussed labs with pt on 09/01/2018 @ noon. He was scheduled for a 3 week lab rechecks  Signed electronically by: Mack Guise, MD  [facility]  [facility] [address] [place], [place] [address] Phone: [phone] FAX: [phone]      CC: Mikey Kirschner, [place] [address] Phone: [phone]  Fax: [phone]  Return to Endocrinology clinic as below: Future Appointments  Date Time Provider [place]  11/01/2018  8:40 AM Mikey Kirschner, MD [facility]

## 2018-09-07 ENCOUNTER — Other Ambulatory Visit: Payer: Self-pay | Admitting: Dermatology

## 2018-09-07 DIAGNOSIS — C44319 Basal cell carcinoma of skin of other parts of face: Secondary | ICD-10-CM | POA: Diagnosis not present

## 2018-09-20 DIAGNOSIS — S0500XA Injury of conjunctiva and corneal abrasion without foreign body, unspecified eye, initial encounter: Secondary | ICD-10-CM | POA: Diagnosis not present

## 2018-09-21 ENCOUNTER — Other Ambulatory Visit: Payer: Self-pay

## 2018-09-22 ENCOUNTER — Telehealth: Payer: Self-pay | Admitting: Internal Medicine

## 2018-09-22 ENCOUNTER — Other Ambulatory Visit (INDEPENDENT_AMBULATORY_CARE_PROVIDER_SITE_OTHER): Payer: 59

## 2018-09-22 DIAGNOSIS — Z9889 Other specified postprocedural states: Secondary | ICD-10-CM | POA: Diagnosis not present

## 2018-09-22 DIAGNOSIS — E890 Postprocedural hypothyroidism: Secondary | ICD-10-CM

## 2018-09-22 LAB — TSH: TSH: 6.41 u[IU]/mL — ABNORMAL HIGH (ref 0.35–4.50)

## 2018-09-22 LAB — T4, FREE: Free T4: 0.68 ng/dL (ref 0.60–1.60)

## 2018-09-22 NOTE — Telephone Encounter (Signed)
Discussed lab results with the patient with normalization of his FT4 but with an elevation of TSH.   His TSH is probably high due to lag effect from previously low FT4.   Results for Cristian Nguyen, Cristian Nguyen (MRN [id number]) as of 09/22/2018 14:42  Ref. Range 09/01/2018 09:52 09/22/2018 08:03  TSH Latest Ref Range: 0.35 - 4.50 uIU/mL 1.37 6.41 (H)  T4,Free(Direct) Latest Ref Range: 0.60 - 1.60 ng/dL 0.40 (L) 0.68     Recommendations   Repeat TFT in 8 weeks.        Abby Nena Jordan, MD  [facility]  [facility] [address] [place], [place] [address] Phone: [phone] FAX: [phone]

## 2018-11-01 ENCOUNTER — Encounter: Payer: Self-pay | Admitting: Family Medicine

## 2018-11-01 ENCOUNTER — Other Ambulatory Visit: Payer: Self-pay

## 2018-11-01 ENCOUNTER — Ambulatory Visit (INDEPENDENT_AMBULATORY_CARE_PROVIDER_SITE_OTHER): Payer: 59 | Admitting: Family Medicine

## 2018-11-01 ENCOUNTER — Encounter: Payer: 59 | Admitting: Family Medicine

## 2018-11-01 DIAGNOSIS — E785 Hyperlipidemia, unspecified: Secondary | ICD-10-CM

## 2018-11-01 DIAGNOSIS — E119 Type 2 diabetes mellitus without complications: Secondary | ICD-10-CM | POA: Diagnosis not present

## 2018-11-01 DIAGNOSIS — I10 Essential (primary) hypertension: Secondary | ICD-10-CM

## 2018-11-01 MED ORDER — SIMVASTATIN 20 MG PO TABS: 20.0000 mg | ORAL_TABLET | Freq: Every day | ORAL | 5 refills | Status: DC

## 2018-11-01 MED ORDER — LISINOPRIL 5 MG PO TABS: ORAL_TABLET | ORAL | 1 refills | Status: DC

## 2018-11-01 MED ORDER — METFORMIN HCL ER 500 MG PO TB24: ORAL_TABLET | ORAL | 5 refills | Status: DC

## 2018-11-01 MED ORDER — ESOMEPRAZOLE MAGNESIUM 40 MG PO CPDR: DELAYED_RELEASE_CAPSULE | ORAL | 5 refills | Status: DC

## 2018-11-01 NOTE — Progress Notes (Signed)
   Subjective:    Patient ID: Cristian Nguyen, male    DOB: 1955-09-14, 63 y.o.   MRN: [id number] Format video Patient arrives for follow-up of numerous concerns Patient present at home Provider present at office Consent for interaction obtained Coronavirus outbreak made virtual visit necessary  Diabetes  He presents for his follow-up diabetic visit. He has type 2 diabetes mellitus. He is compliant with treatment all of the time. Diabetic current diet: metformin 500mg  one bid. Home blood sugar record trend: 100-120 in the mornings. He does not see a podiatrist.Eye exam is current (one and a half months ago).   Virtual Visit via Video Note  I connected with Cristian Nguyen on 11/01/18 at  9:30 AM EDT by a video enabled telemedicine application and verified that I am speaking with the correct person using two identifiers.   I discussed the limitations of evaluation and management by telemedicine and the availability of in person appointments. The patient expressed understanding and agreed to proceed.  History of Present Illness:    Observations/Objective:   Assessment and Plan:   Follow Up Instructions:    I discussed the assessment and treatment plan with the patient. The patient was provided an opportunity to ask questions and all were answered. The patient agreed with the plan and demonstrated an understanding of the instructions.   The patient was advised to call back or seek an in-person evaluation if the symptoms worsen or if the condition fails to improve as anticipated.  I provided 58minutes of non-face-to-face time during this encounter.  Blood pressure medicine and blood pressure levels reviewed today with patient. Compliant with blood pressure medicine. States does not miss a dose. No obvious side effects. Blood pressure generally good when checked elsewhere. Watching salt intake.   Patient continues to take lipid medication regularly. No obvious side effects from it.  Generally does not miss a dose. Prior blood work results are reviewed with patient. Patient continues to work on fat intake in diet  Patient claims compliance with diabetes medication. No obvious side effects. Reports no substantial low sugar spells. Most numbers are generally in good range when checked fasting. Generally does not miss a dose of medication. Watching diabetic diet closely  Patient now on thyroid supplement.  Status post thyroidectomy.  Fortunately pathology revealed no cancer.  Handling medicines well    Review of Systems No headache, no major weight loss or weight gain, no chest pain no back pain abdominal pain no change in bowel habits complete ROS otherwise negative     Objective:   Physical Exam  Virtual visit      Assessment & Plan:  Impression 1 type 2 diabetes good control discussed maintain same meds  2.  Hypertension.  Good control discussed maintain same meds compliance discussed  3.  Hyperlipidemia.  Hold off on blood work this time rationale discussed.  Maintain medications.  Diet discussed  4.  Hypothyroidism.  Discussed handling meds well  Follow-up in 6 months wellness plus chronic then

## 2018-12-12 ENCOUNTER — Telehealth: Payer: Self-pay | Admitting: Family Medicine

## 2018-12-12 MED ORDER — METFORMIN HCL 500 MG PO TABS: ORAL_TABLET | ORAL | 3 refills | Status: DC

## 2018-12-12 NOTE — Telephone Encounter (Signed)
Change to reg at same dosing

## 2018-12-12 NOTE — Telephone Encounter (Signed)
Fax from pharmacy regarding Metformin ER 500 mg tab. Take 2 tablets by mouth twice daily. Fax from pharmacy stating that all Metformin ER formulations recalled/back ordered. Please advise and change RX accordingly. Thank you.

## 2018-12-12 NOTE — Telephone Encounter (Signed)
Medication sent in and pt notified. 

## 2018-12-12 NOTE — Addendum Note (Signed)
Addended by: Vicente Males on: 12/12/2018 04:05 PM   Modules accepted: Orders

## 2018-12-14 ENCOUNTER — Other Ambulatory Visit: Payer: Self-pay | Admitting: Dermatology

## 2019-01-30 ENCOUNTER — Telehealth: Payer: Self-pay | Admitting: Internal Medicine

## 2019-01-30 ENCOUNTER — Other Ambulatory Visit: Payer: Self-pay

## 2019-01-30 ENCOUNTER — Other Ambulatory Visit (INDEPENDENT_AMBULATORY_CARE_PROVIDER_SITE_OTHER): Payer: 59

## 2019-01-30 DIAGNOSIS — E890 Postprocedural hypothyroidism: Secondary | ICD-10-CM

## 2019-01-30 DIAGNOSIS — Z9889 Other specified postprocedural states: Secondary | ICD-10-CM | POA: Diagnosis not present

## 2019-01-30 LAB — T4, FREE: Free T4: 0.67 ng/dL (ref 0.60–1.60)

## 2019-01-30 LAB — TSH: TSH: 5.76 u[IU]/mL — ABNORMAL HIGH (ref 0.35–4.50)

## 2019-01-30 MED ORDER — LEVOTHYROXINE SODIUM 50 MCG PO TABS: 50.0000 ug | ORAL_TABLET | Freq: Every day | ORAL | 3 refills | Status: DC

## 2019-01-30 NOTE — Telephone Encounter (Signed)
Discussed continued abnormal thyroid results as below    Pt has occasional fatigue which started after his subtotal thyroidectomy.  No constipation      Results for RONAN, DUECKER (MRN [id number]) as of 01/30/2019 14:26  Ref. Range 07/07/2018 09:59 08/14/2018 14:08 09/01/2018 09:52 09/22/2018 08:03 01/30/2019 08:11  TSH Latest Ref Range: 0.35 - 4.50 uIU/mL <0.01 (L)  1.37 6.41 (H) 5.76 (H)  T4,Free(Direct) Latest Ref Range: 0.60 - 1.60 ng/dL 1.46  0.40 (L) 0.68 0.67    Recommendations  Start Levothyroxine 50 mcg daily  - Pt educated extensively on the correct way to take levothyroxine (first thing in the morning with water, 30 minutes before eating or taking other medications).   Pt advised to contact the office to schedule an OV 2 months out    [place], MD  [facility]  [facility] [address] [place], [place] [address] Phone: [phone] FAX: [phone]

## 2019-04-10 ENCOUNTER — Other Ambulatory Visit: Payer: Self-pay | Admitting: Internal Medicine

## 2019-04-10 ENCOUNTER — Other Ambulatory Visit (INDEPENDENT_AMBULATORY_CARE_PROVIDER_SITE_OTHER): Payer: 59

## 2019-04-10 ENCOUNTER — Other Ambulatory Visit: Payer: Self-pay

## 2019-04-10 DIAGNOSIS — E890 Postprocedural hypothyroidism: Secondary | ICD-10-CM

## 2019-04-10 DIAGNOSIS — Z9889 Other specified postprocedural states: Secondary | ICD-10-CM | POA: Diagnosis not present

## 2019-04-10 LAB — T4, FREE: Free T4: 1 ng/dL (ref 0.60–1.60)

## 2019-04-10 LAB — TSH: TSH: 2.61 u[IU]/mL (ref 0.35–4.50)

## 2019-04-19 ENCOUNTER — Ambulatory Visit: Payer: 59 | Admitting: Internal Medicine

## 2019-04-24 ENCOUNTER — Other Ambulatory Visit: Payer: Self-pay

## 2019-04-24 DIAGNOSIS — Z20822 Contact with and (suspected) exposure to covid-19: Secondary | ICD-10-CM

## 2019-04-25 LAB — NOVEL CORONAVIRUS, NAA: SARS-CoV-2, NAA: NOT DETECTED

## 2019-04-26 ENCOUNTER — Telehealth: Payer: Self-pay | Admitting: Family Medicine

## 2019-04-26 NOTE — Telephone Encounter (Signed)
Negative COVID results given. Patient results "NOT Detected." Caller expressed understanding. ° °

## 2019-05-15 ENCOUNTER — Other Ambulatory Visit: Payer: Self-pay | Admitting: Family Medicine

## 2019-05-15 NOTE — Telephone Encounter (Signed)
Pt has CPE scheduled 11/24

## 2019-05-15 NOTE — Telephone Encounter (Signed)
Please contact patient to set up appt; then may route back to nurses. Thank you  

## 2019-05-15 NOTE — Telephone Encounter (Signed)
Time for wellness plus chronic f to f if willinf, if not virt chronic, tjen may ref times one

## 2019-05-17 ENCOUNTER — Telehealth: Payer: Self-pay | Admitting: Family Medicine

## 2019-05-17 DIAGNOSIS — Z125 Encounter for screening for malignant neoplasm of prostate: Secondary | ICD-10-CM

## 2019-05-17 DIAGNOSIS — E785 Hyperlipidemia, unspecified: Secondary | ICD-10-CM

## 2019-05-17 DIAGNOSIS — E119 Type 2 diabetes mellitus without complications: Secondary | ICD-10-CM

## 2019-05-17 DIAGNOSIS — Z79899 Other long term (current) drug therapy: Secondary | ICD-10-CM

## 2019-05-17 DIAGNOSIS — I10 Essential (primary) hypertension: Secondary | ICD-10-CM

## 2019-05-17 DIAGNOSIS — E0590 Thyrotoxicosis, unspecified without thyrotoxic crisis or storm: Secondary | ICD-10-CM

## 2019-05-17 NOTE — Telephone Encounter (Signed)
Last labs 10/20- TSH, T4                  2/20- HgbA1c, CBC, Met 7                 10/19- Lipid, Liver, met 7, HgbA1c, PSA

## 2019-05-17 NOTE — Telephone Encounter (Signed)
Pt would like to know if Dr. Richardson Landry would like him to have lab work done before CPE

## 2019-05-18 NOTE — Telephone Encounter (Signed)
Lab orders placed. Left message to return call  

## 2019-05-18 NOTE — Telephone Encounter (Signed)
Lip liv m7 A1c tsh cbc psa

## 2019-05-18 NOTE — Telephone Encounter (Signed)
Pt informed about lab work

## 2019-05-22 LAB — HEPATIC FUNCTION PANEL
ALT: 34 IU/L (ref 0–44)
AST: 33 IU/L (ref 0–40)
Albumin: 4.4 g/dL (ref 3.8–4.8)
Alkaline Phosphatase: 69 IU/L (ref 39–117)
Bilirubin Total: 0.6 mg/dL (ref 0.0–1.2)
Bilirubin, Direct: 0.16 mg/dL (ref 0.00–0.40)
Total Protein: 6.7 g/dL (ref 6.0–8.5)

## 2019-05-22 LAB — TSH: TSH: 2.36 u[IU]/mL (ref 0.450–4.500)

## 2019-05-22 LAB — CBC WITH DIFFERENTIAL/PLATELET
Basophils Absolute: 0.1 10*3/uL (ref 0.0–0.2)
Basos: 1 %
EOS (ABSOLUTE): 0.2 10*3/uL (ref 0.0–0.4)
Eos: 2 %
Hematocrit: 43.5 % (ref 37.5–51.0)
Hemoglobin: 15.3 g/dL (ref 13.0–17.7)
Immature Grans (Abs): 0 10*3/uL (ref 0.0–0.1)
Immature Granulocytes: 1 %
Lymphocytes Absolute: 2.6 10*3/uL (ref 0.7–3.1)
Lymphs: 40 %
MCH: 31 pg (ref 26.6–33.0)
MCHC: 35.2 g/dL (ref 31.5–35.7)
MCV: 88 fL (ref 79–97)
Monocytes Absolute: 0.4 10*3/uL (ref 0.1–0.9)
Monocytes: 6 %
Neutrophils Absolute: 3.4 10*3/uL (ref 1.4–7.0)
Neutrophils: 50 %
Platelets: 206 10*3/uL (ref 150–450)
RBC: 4.94 x10E6/uL (ref 4.14–5.80)
RDW: 12.4 % (ref 11.6–15.4)
WBC: 6.7 10*3/uL (ref 3.4–10.8)

## 2019-05-22 LAB — BASIC METABOLIC PANEL
BUN/Creatinine Ratio: 15 (ref 10–24)
BUN: 14 mg/dL (ref 8–27)
CO2: 26 mmol/L (ref 20–29)
Calcium: 9.3 mg/dL (ref 8.6–10.2)
Chloride: 98 mmol/L (ref 96–106)
Creatinine, Ser: 0.95 mg/dL (ref 0.76–1.27)
GFR calc Af Amer: 99 mL/min/{1.73_m2} (ref >59)
GFR calc non Af Amer: 85 mL/min/{1.73_m2} (ref >59)
Glucose: 158 mg/dL — ABNORMAL HIGH (ref 65–99)
Potassium: 4.6 mmol/L (ref 3.5–5.2)
Sodium: 138 mmol/L (ref 134–144)

## 2019-05-22 LAB — LIPID PANEL
Chol/HDL Ratio: 5.1 ratio — ABNORMAL HIGH (ref 0.0–5.0)
Cholesterol, Total: 184 mg/dL (ref 100–199)
HDL: 36 mg/dL — ABNORMAL LOW (ref >39)
LDL Chol Calc (NIH): 101 mg/dL — ABNORMAL HIGH (ref 0–99)
Triglycerides: 277 mg/dL — ABNORMAL HIGH (ref 0–149)
VLDL Cholesterol Cal: 47 mg/dL — ABNORMAL HIGH (ref 5–40)

## 2019-05-22 LAB — HEMOGLOBIN A1C
Est. average glucose Bld gHb Est-mCnc: 166 mg/dL
Hgb A1c MFr Bld: 7.4 % — ABNORMAL HIGH (ref 4.8–5.6)

## 2019-05-22 LAB — PSA: Prostate Specific Ag, Serum: 1.6 ng/mL (ref 0.0–4.0)

## 2019-05-29 ENCOUNTER — Encounter: Payer: Self-pay | Admitting: Family Medicine

## 2019-05-29 ENCOUNTER — Ambulatory Visit (INDEPENDENT_AMBULATORY_CARE_PROVIDER_SITE_OTHER): Payer: 59 | Admitting: Family Medicine

## 2019-05-29 ENCOUNTER — Other Ambulatory Visit: Payer: Self-pay

## 2019-05-29 ENCOUNTER — Telehealth: Payer: Self-pay | Admitting: *Deleted

## 2019-05-29 VITALS — BP 132/88 | Temp 97.6°F | Ht 73.5 in | Wt 270.0 lb

## 2019-05-29 DIAGNOSIS — Z0000 Encounter for general adult medical examination without abnormal findings: Secondary | ICD-10-CM | POA: Diagnosis not present

## 2019-05-29 DIAGNOSIS — E119 Type 2 diabetes mellitus without complications: Secondary | ICD-10-CM | POA: Diagnosis not present

## 2019-05-29 DIAGNOSIS — Z23 Encounter for immunization: Secondary | ICD-10-CM

## 2019-05-29 DIAGNOSIS — I10 Essential (primary) hypertension: Secondary | ICD-10-CM

## 2019-05-29 MED ORDER — BLOOD GLUCOSE METER KIT: PACK | 0 refills | Status: DC

## 2019-05-29 MED ORDER — GLIPIZIDE 5 MG PO TABS: ORAL_TABLET | ORAL | 1 refills | Status: DC

## 2019-05-29 MED ORDER — METFORMIN HCL 500 MG PO TABS: ORAL_TABLET | ORAL | 1 refills | Status: DC

## 2019-05-29 MED ORDER — LISINOPRIL 5 MG PO TABS: ORAL_TABLET | ORAL | 1 refills | Status: DC

## 2019-05-29 MED ORDER — SIMVASTATIN 20 MG PO TABS: 20.0000 mg | ORAL_TABLET | Freq: Every day | ORAL | 1 refills | Status: DC

## 2019-05-29 MED ORDER — ESOMEPRAZOLE MAGNESIUM 40 MG PO CPDR: DELAYED_RELEASE_CAPSULE | ORAL | 1 refills | Status: DC

## 2019-05-29 NOTE — Progress Notes (Signed)
Subjective:    Patient ID: Cristian Nguyen, male    DOB: Oct 05, 1955, 63 y.o.   MRN: [id number]  HPI The patient comes in today for a wellness visit.    A review of their health history was completed.  A review of medications was also completed.  Any needed refills; update all meds  Eating habits:  Eats everything  Falls/  MVA accidents in past few months: none  Regular exercise: plays golf and does side jobs, stays active  Specialist pt sees on regular basis: dr Kelton Pillar for thyroid  Preventative health issues were discussed.   Additional concerns: concerns about side effects of metformin that he saw on tv.  Would like to discuss checking blood sugar. States he gets a lot of errors and would like to discuss getting freestyle libre system so he doesn't have to poke his fingers.   Patient claims compliance with diabetes medication. No obvious side effects. Reports no substantial low sugar spells. Most numbers are generally in good range when checked fasting. Generally does not miss a dose of medication. Watching diabetic diet closely  Blood pressure medicine and blood pressure levels reviewed today with patient. Compliant with blood pressure medicine. States does not miss a dose. No obvious side effects. Blood pressure generally good when checked elsewhere. Watching salt intake.   Patient continues to take lipid medication regularly. No obvious side effects from it. Generally does not miss a dose. Prior blood work results are reviewed with patient. Patient continues to work on fat intake in diet  Results for orders placed or performed in visit on 05/17/19  Lipid Profile  Result Value Ref Range   Cholesterol, Total 184 100 - 199 mg/dL   Triglycerides 277 (H) 0 - 149 mg/dL   HDL 36 (L) >39 mg/dL   VLDL Cholesterol Cal 47 (H) 5 - 40 mg/dL   LDL Chol Calc (NIH) 101 (H) 0 - 99 mg/dL   Chol/HDL Ratio 5.1 (H) 0.0 - 5.0 ratio  Hepatic function panel  Result Value Ref Range   Total Protein 6.7 6.0 - 8.5 g/dL   Albumin 4.4 3.8 - 4.8 g/dL   Bilirubin Total 0.6 0.0 - 1.2 mg/dL   Bilirubin, Direct 0.16 0.00 - 0.40 mg/dL   Alkaline Phosphatase 69 39 - 117 IU/L   AST 33 0 - 40 IU/L   ALT 34 0 - 44 IU/L  Basic Metabolic Panel (BMET)  Result Value Ref Range   Glucose 158 (H) 65 - 99 mg/dL   BUN 14 8 - 27 mg/dL   Creatinine, Ser 0.95 0.76 - 1.27 mg/dL   GFR calc non Af Amer 85 >59 mL/min/1.73   GFR calc Af Amer 99 >59 mL/min/1.73   BUN/Creatinine Ratio 15 10 - 24   Sodium 138 134 - 144 mmol/L   Potassium 4.6 3.5 - 5.2 mmol/L   Chloride 98 96 - 106 mmol/L   CO2 26 20 - 29 mmol/L   Calcium 9.3 8.6 - 10.2 mg/dL  Hemoglobin A1c  Result Value Ref Range   Hgb A1c MFr Bld 7.4 (H) 4.8 - 5.6 %   Est. average glucose Bld gHb Est-mCnc 166 mg/dL  TSH  Result Value Ref Range   TSH 2.360 0.450 - 4.500 uIU/mL  CBC with Differential  Result Value Ref Range   WBC 6.7 3.4 - 10.8 x10E3/uL   RBC 4.94 4.14 - 5.80 x10E6/uL   Hemoglobin 15.3 13.0 - 17.7 g/dL   Hematocrit 43.5 37.5 - 51.0 %  MCV 88 79 - 97 fL   MCH 31.0 26.6 - 33.0 pg   MCHC 35.2 31.5 - 35.7 g/dL   RDW 12.4 11.6 - 15.4 %   Platelets 206 150 - 450 x10E3/uL   Neutrophils 50 Not Estab. %   Lymphs 40 Not Estab. %   Monocytes 6 Not Estab. %   Eos 2 Not Estab. %   Basos 1 Not Estab. %   Neutrophils Absolute 3.4 1.4 - 7.0 x10E3/uL   Lymphocytes Absolute 2.6 0.7 - 3.1 x10E3/uL   Monocytes Absolute 0.4 0.1 - 0.9 x10E3/uL   EOS (ABSOLUTE) 0.2 0.0 - 0.4 x10E3/uL   Basophils Absolute 0.1 0.0 - 0.2 x10E3/uL   Immature Granulocytes 1 Not Estab. %   Immature Grans (Abs) 0.0 0.0 - 0.1 x10E3/uL  PSA  Result Value Ref Range   Prostate Specific Ag, Serum 1.6 0.0 - 4.0 ng/mL   Patient claims compliance with diabetes medication. No obvious side effects. Reports no substantial low sugar spells. Most numbers are generally in good range when checked fasting. Generally does not miss a dose of medication. Watching diabetic  diet closely  Blood pressure medicine and blood pressure levels reviewed today with patient. Compliant with blood pressure medicine. States does not miss a dose. No obvious side effects. Blood pressure generally good when checked elsewhere. Watching salt intake.   Patient unfortunately has had considerable loose stools on full dose of Metformin.  Also A1c has risen considerably.  Will decrease the Metformin dose and add Glucotrol rationale discussed  Review of Systems  Constitutional: Negative for activity change, appetite change and fever.  HENT: Negative for congestion and rhinorrhea.   Eyes: Negative for discharge.  Respiratory: Negative for cough and wheezing.   Cardiovascular: Negative for chest pain.  Gastrointestinal: Negative for abdominal pain, blood in stool and vomiting.  Genitourinary: Negative for difficulty urinating and frequency.  Musculoskeletal: Negative for neck pain.  Skin: Negative for rash.  Allergic/Immunologic: Negative for environmental allergies and food allergies.  Neurological: Negative for weakness and headaches.  Psychiatric/Behavioral: Negative for agitation.  All other systems reviewed and are negative.      Objective:   Physical Exam Vitals signs reviewed.  Constitutional:      Appearance: He is well-developed.  HENT:     Head: Normocephalic and atraumatic.     Right Ear: External ear normal.     Left Ear: External ear normal.     Nose: Nose normal.  Eyes:     Pupils: Pupils are equal, round, and reactive to light.  Neck:     Musculoskeletal: Normal range of motion and neck supple.     Thyroid: No thyromegaly.  Cardiovascular:     Rate and Rhythm: Normal rate and regular rhythm.     Heart sounds: Normal heart sounds. No murmur.  Pulmonary:     Effort: Pulmonary effort is normal. No respiratory distress.     Breath sounds: Normal breath sounds. No wheezing.  Abdominal:     General: Bowel sounds are normal. There is no distension.      Palpations: Abdomen is soft. There is no mass.     Tenderness: There is no abdominal tenderness.  Genitourinary:    Penis: Normal.   Musculoskeletal: Normal range of motion.  Lymphadenopathy:     Cervical: No cervical adenopathy.  Skin:    General: Skin is warm and dry.     Findings: No erythema.  Neurological:     Mental Status: He is alert.  Motor: No abnormal muscle tone.  Psychiatric:        Behavior: Behavior normal.        Judgment: Judgment normal.     Prostate exam within normal limits      Assessment & Plan:  Impression wellness exam.  Diet discussed.  Exercise discussed.  Colonoscopy discussed.  Blood work discussed.  Vaccines discussed and flu shot given.  Tdap also.  Medications refilled  2.  Type 2 diabetes.  Decrease Metformin to 1 twice daily add Glucotrol.  Rationale discussed.  This may help tighten the A1c and helps her GI symptoms also  3.  Snoring patient noted substantial snoring.  He would like to try to lose some weight and see how things go over the next 6 months.  Before considering full work-up

## 2019-05-29 NOTE — Telephone Encounter (Signed)
[place] questionnaire done and put in dr steve's folder for review.

## 2019-06-15 ENCOUNTER — Other Ambulatory Visit: Payer: Self-pay

## 2019-06-19 ENCOUNTER — Encounter: Payer: Self-pay | Admitting: Internal Medicine

## 2019-06-19 ENCOUNTER — Ambulatory Visit: Payer: 59 | Admitting: Internal Medicine

## 2019-06-19 ENCOUNTER — Other Ambulatory Visit: Payer: Self-pay

## 2019-06-19 VITALS — BP 130/88 | HR 67 | Temp 97.6°F | Ht 74.0 in | Wt 275.0 lb

## 2019-06-19 DIAGNOSIS — E890 Postprocedural hypothyroidism: Secondary | ICD-10-CM | POA: Diagnosis not present

## 2019-06-19 DIAGNOSIS — Z9889 Other specified postprocedural states: Secondary | ICD-10-CM

## 2019-06-19 MED ORDER — LEVOTHYROXINE SODIUM 50 MCG PO TABS: 50.0000 ug | ORAL_TABLET | Freq: Every day | ORAL | 3 refills | Status: DC

## 2019-06-19 NOTE — Patient Instructions (Signed)

## 2019-06-19 NOTE — Progress Notes (Signed)
Name: Cristian Nguyen  MRN/ DOB: [id number], 02-03-1956    Age/ Sex: 63 y.o., male     PCP: Mikey Kirschner, MD   Reason for Endocrinology Evaluation: 05/24/2018     Initial Endocrinology Clinic Visit: Hyperthyroidism     PATIENT IDENTIFIER: Cristian Nguyen is a 63 y.o., male with a past medical history of HTN, T2DM and GERD . He has followed with [facility] since 05/24/18 for consultative assistance with management of his hyperthyroidism.   HISTORICAL SUMMARY: The patient presented to his PCP in October, 2019 with c/o neck enlargement, a thyroid ultrasound showed a 6.8 cm right thyroid solid cystic nodule.  His TSH was suppressed at <0.01 uIU/mL with elevated FT4 at 1.81 ng/dL . He was started on Methimazole.  Thyroid uptake and scan on 06/09/2018 shows toxic right adenoma with scattered areas of cold and warm area.  He underwent right subtotal thyroidectomy on 08/15/2018 with benign pathology. TSH continued to be elevated and we started LT-4 replacement by 01/2019    SUBJECTIVE:   During last visit (09/01/2018): Post- Op follow up. No treatment provided.     Today (06/19/2019):  Cristian Nguyen is here with his wife for a follow up  On post-operative hypothyroidisms.  He has been noted with weight gain for the past year.  He does endorse fatigue and at times he falls asleep when inactive.  He does snore at night.  Denies depression  Denies tingling and numbness of the hands  No constipation except for the past couple of weeks some irregularities have been noted   ROS:  As per HPI.   HISTORY:  Past Medical History:  Past Medical History:  Diagnosis Date  . Atypical nevus 07/30/2010   Left Upper Paraspinal-Moderate to Severe (w/s widershave)  . BCC (basal cell carcinoma of skin) 07/30/2010   Left Side of Nose  . BCC (basal cell carcinoma of skin) 03/26/2014   Left Cheek (Cx3,5FU)  . COPD (chronic obstructive pulmonary disease) ([place])   . GERD  (gastroesophageal reflux disease)   . History of basal cell carcinoma (BCC) excision    07-31-2018 x2  cheek area  . Hyperlipidemia   . Hypertension   . Hyperthyroidism    endocrinologist-- dr Crissie Sickles (note in epic)  . Nocturia   . 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   Left Sideburn (Cx3,5FU)  . 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   Left Cheek (Cx3,5FU)  . Nodular basal cell carcinoma (BCC) 12/14/2018   Right Deltoid Post (tx p bx)  . OA (osteoarthritis)   . Thyroid adenoma, toxic    right side  . Type 2 diabetes mellitus ([place])    followed by pcp  . Wears contact lenses    Past Surgical History:  Past Surgical History:  Procedure Laterality Date  . COLONOSCOPY  02/29/2012   Procedure: COLONOSCOPY;  Surgeon: Jamesetta So, MD;  [facility];  Service: Gastroenterology;  Laterality: N/A;  . COLONOSCOPY N/A 01/03/2018   Procedure: COLONOSCOPY;  Surgeon: Aviva Signs, MD;  [facility];  Service: Gastroenterology;  Laterality: N/A;  . KNEE ARTHROSCOPY Right 04-25-2009  dr Aline Brochure '@[facility]   . LAPAROSCOPIC CHOLECYSTECTOMY  11-07-2004   dr Arnoldo Morale '@[facility]   . THYROID LOBECTOMY Right 08/15/2018   Procedure: RIGHT THYROID LOBECTOMY;  Surgeon: Armandina Gemma, MD;  Location: [facility];  Service: General;  Laterality: Right;    Social History:  reports that he quit smoking about 12 years ago. His smoking  use included cigarettes. He quit after 35.00 years of use. He has never used smokeless tobacco. He reports current alcohol use. He reports that he does not use drugs.  Family History: family history includes Cancer in his brother and father; Heart disease in his father; Hypertension in his mother.   HOME MEDICATIONS: Allergies as of 06/19/2019   No Known Allergies     Medication List       Accurate as of June 19, 2019  9:42 AM. If you have any questions, ask your nurse or doctor.        blood glucose meter kit and supplies Dispense based on  patient and insurance preference. Use to check blood sugar once daily. For ICD- 10  E11.9   esomeprazole 40 MG capsule Commonly known as: NEXIUM TAKE ONE CAPSULE BY MOUTH ONCE DAILY BEFORE BREAKFAST   glipiZIDE 5 MG tablet Commonly known as: GLUCOTROL Take one bid   levothyroxine 50 MCG tablet Commonly known as: SYNTHROID Take 1 tablet (50 mcg total) by mouth daily.   lisinopril 5 MG tablet Commonly known as: ZESTRIL TAKE 1 TABLET BY MOUTH EVERY DAY AT BEDTIME   metFORMIN 500 MG tablet Commonly known as: GLUCOPHAGE Take one po bid   simvastatin 20 MG tablet Commonly known as: ZOCOR Take 1 tablet (20 mg total) by mouth daily with breakfast.          OBJECTIVE:   PHYSICAL EXAM: VS: BP 130/88 (BP Location: Left Arm, Patient Position: Sitting, Cuff Size: Large)   Pulse 67   Temp 97.6 F (36.4 C)   Ht '6\' 2"'$  (1.88 m)   Wt 275 lb (124.7 kg)   SpO2 97%   BMI 35.31 kg/m    EXAM: General: Pt appears well and is in NAD  Neck: General: Supple without adenopathy. Thyroid: Anterior neck surgical incision is clean, no evidence of infection.  Lungs: Clear with good BS bilat with no rales, rhonchi, or wheezes  Heart: Auscultation: RRR.  Abdomen: Normoactive bowel sounds, soft, nontender, without masses or organomegaly palpable  Extremities:  BL LE: No pretibial edema normal ROM and strength.  Mental Status: Judgment, insight: Intact Orientation: Oriented to time, place, and person Mood and affect: No depression, anxiety, or agitation     DATA REVIEWED: Results for Cristian Nguyen, Cristian Nguyen (MRN [id number]) as of 06/19/2019 09:42  Ref. Range 09/22/2018 08:03 01/30/2019 08:11 04/10/2019 08:08 05/21/2019 11:54  TSH Latest Ref Range: 0.450 - 4.500 uIU/mL 6.41 (H) 5.76 (H) 2.61 2.360  T4,Free(Direct) Latest Ref Range: 0.60 - 1.60 ng/dL 0.68 0.67 1.00    ASSESSMENT / PLAN / RECOMMENDATIONS:   1. Postoperative Hypothyroidism :  - Pt is S/P Right subtotal thyroidectomy  secondary to toxic adenoma  - Pt is clinically euthyroid  - Has had 2 normal TSH levels on current dose of levothyroxine - No local neck symptoms  - Pt educated extensively on the correct way to take levothyroxine (first thing in the morning with water, 30 minutes before eating or taking other medications). - Pt encouraged to double dose the following day if she were to miss a dose given long half-life of levothyroxine.  Medication: Levothyroxine 50 mcg daily    Recommend continuation of current Tx with primary MD and consultative f/u at [facility] in the future if pt's control becomes problematic.   F/U PRN    Signed electronically by: Mack Guise, MD  [facility]  [address],  [place] [address] Phone: [phone] FAX: [phone]      CC: Mikey Kirschner, [place] [address] Phone: [phone]  Fax: [phone]   Return to Endocrinology clinic as below: Future Appointments  Date Time Provider Wise  11/26/2019  9:00 AM Mikey Kirschner, MD [facility] [place]

## 2019-07-17 ENCOUNTER — Ambulatory Visit (INDEPENDENT_AMBULATORY_CARE_PROVIDER_SITE_OTHER): Payer: 59 | Admitting: Family Medicine

## 2019-07-17 ENCOUNTER — Other Ambulatory Visit: Payer: Self-pay

## 2019-07-17 ENCOUNTER — Encounter: Payer: Self-pay | Admitting: Family Medicine

## 2019-07-17 DIAGNOSIS — J329 Chronic sinusitis, unspecified: Secondary | ICD-10-CM

## 2019-07-17 DIAGNOSIS — J310 Chronic rhinitis: Secondary | ICD-10-CM | POA: Diagnosis not present

## 2019-07-17 MED ORDER — AMOXICILLIN-POT CLAVULANATE 875-125 MG PO TABS: 1.0000 | ORAL_TABLET | Freq: Two times a day (BID) | ORAL | 0 refills | Status: AC

## 2019-07-17 NOTE — Progress Notes (Signed)
   Subjective:  Audio only    Patient ID: Cristian Nguyen, male    DOB: 1955-09-21, 64 y.o.   MRN: [id number]  Sinus Problem This is a new problem. The current episode started in the past 7 days. Associated symptoms include congestion. (Chest congestion-feels like flu)   Covid test done Sunday and came back today negative   Review of Systems  HENT: Positive for congestion.    Virtual Visit via Video Note  I connected with [name] on 07/17/19 at 11:00 AM EST by a video enabled telemedicine application and verified that I am speaking with the correct person using two identifiers.  Location: Patient: home Provider: office   I discussed the limitations of evaluation and management by telemedicine and the availability of in person appointments. The patient expressed understanding and agreed to proceed.  History of Present Illness:    Observations/Objective:   Assessment and Plan:   Follow Up Instructions:    I discussed the assessment and treatment plan with the patient. The patient was provided an opportunity to ask questions and all were answered. The patient agreed with the plan and demonstrated an understanding of the instructions.   The patient was advised to call back or seek an in-person evaluation if the symptoms worsen or if the condition fails to improve as anticipated.  I provided 20 minutes of non-face-to-face time during this encounter.  Patient notes congestion.  Primarily in sinus area.  With some frontal headache.  Some drainage and discomfort  Intermittent cough into the chest.  No shortness of breath.  Cough productive at times. Feels achy No known close exposure to COVID-19  Patient had a PCR test which was negative      Objective:   Physical Exam   Virtual     Assessment & Plan:  Impression rhinosinusitis/bronchitis with negative PCR test for COVID-19.  Limitations of testing and implications discussed carefully.  Augmentin twice  daily 10 days symptom care discussed

## 2019-08-09 ENCOUNTER — Encounter: Payer: Self-pay | Admitting: Family Medicine

## 2019-09-15 ENCOUNTER — Other Ambulatory Visit: Payer: Self-pay | Admitting: Family Medicine

## 2019-11-20 ENCOUNTER — Telehealth: Payer: Self-pay | Admitting: Family Medicine

## 2019-11-20 NOTE — Telephone Encounter (Signed)
Last labs completed on 05/21/2019 Lipid, Hepatic, Bmet, A1C, TSH, CBC and PSA. Please advise. Thank you

## 2019-11-20 NOTE — Telephone Encounter (Signed)
Patient is requesting labs for physical on 5/25

## 2019-11-21 ENCOUNTER — Other Ambulatory Visit: Payer: Self-pay | Admitting: *Deleted

## 2019-11-21 DIAGNOSIS — Z79899 Other long term (current) drug therapy: Secondary | ICD-10-CM

## 2019-11-21 DIAGNOSIS — I10 Essential (primary) hypertension: Secondary | ICD-10-CM

## 2019-11-21 DIAGNOSIS — E785 Hyperlipidemia, unspecified: Secondary | ICD-10-CM

## 2019-11-21 DIAGNOSIS — E119 Type 2 diabetes mellitus without complications: Secondary | ICD-10-CM

## 2019-11-21 NOTE — Telephone Encounter (Signed)
Pt.notified

## 2019-11-21 NOTE — Telephone Encounter (Signed)
Lm for pt to return call --labs ordered.

## 2019-11-21 NOTE — Telephone Encounter (Signed)
A1c lip liv

## 2019-11-24 LAB — HEPATIC FUNCTION PANEL
ALT: 38 IU/L (ref 0–44)
AST: 30 IU/L (ref 0–40)
Albumin: 4.5 g/dL (ref 3.8–4.8)
Alkaline Phosphatase: 68 IU/L (ref 48–121)
Bilirubin Total: 0.6 mg/dL (ref 0.0–1.2)
Bilirubin, Direct: 0.17 mg/dL (ref 0.00–0.40)
Total Protein: 6.5 g/dL (ref 6.0–8.5)

## 2019-11-24 LAB — HEMOGLOBIN A1C
Est. average glucose Bld gHb Est-mCnc: 163 mg/dL
Hgb A1c MFr Bld: 7.3 % — ABNORMAL HIGH (ref 4.8–5.6)

## 2019-11-24 LAB — LIPID PANEL
Chol/HDL Ratio: 4.4 ratio (ref 0.0–5.0)
Cholesterol, Total: 155 mg/dL (ref 100–199)
HDL: 35 mg/dL — ABNORMAL LOW (ref >39)
LDL Chol Calc (NIH): 88 mg/dL (ref 0–99)
Triglycerides: 184 mg/dL — ABNORMAL HIGH (ref 0–149)
VLDL Cholesterol Cal: 32 mg/dL (ref 5–40)

## 2019-11-26 ENCOUNTER — Other Ambulatory Visit: Payer: Self-pay

## 2019-11-26 ENCOUNTER — Ambulatory Visit: Payer: 59 | Admitting: Family Medicine

## 2019-11-26 ENCOUNTER — Encounter: Payer: Self-pay | Admitting: Family Medicine

## 2019-11-26 VITALS — BP 138/86 | Temp 97.2°F | Wt 271.2 lb

## 2019-11-26 DIAGNOSIS — E785 Hyperlipidemia, unspecified: Secondary | ICD-10-CM | POA: Diagnosis not present

## 2019-11-26 DIAGNOSIS — I10 Essential (primary) hypertension: Secondary | ICD-10-CM

## 2019-11-26 DIAGNOSIS — E119 Type 2 diabetes mellitus without complications: Secondary | ICD-10-CM | POA: Diagnosis not present

## 2019-11-26 MED ORDER — ESOMEPRAZOLE MAGNESIUM 40 MG PO CPDR: DELAYED_RELEASE_CAPSULE | ORAL | 1 refills | Status: DC

## 2019-11-26 MED ORDER — SIMVASTATIN 20 MG PO TABS: 20.0000 mg | ORAL_TABLET | Freq: Every day | ORAL | 1 refills | Status: DC

## 2019-11-26 MED ORDER — METFORMIN HCL 500 MG PO TABS: ORAL_TABLET | ORAL | 1 refills | Status: DC

## 2019-11-26 MED ORDER — GLIPIZIDE ER 5 MG PO TB24: ORAL_TABLET | ORAL | 1 refills | Status: DC

## 2019-11-26 MED ORDER — LISINOPRIL 5 MG PO TABS: ORAL_TABLET | ORAL | 1 refills | Status: DC

## 2019-11-26 NOTE — Progress Notes (Signed)
   Subjective:    Patient ID: Cristian Nguyen, male    DOB: [birth year], 64 y.o.   MRN: [id number]  Diabetes He presents for his follow-up diabetic visit. He has type 2 diabetes mellitus. There are no diabetic associated symptoms. Current diabetic treatments: glipizide and metformin. He is compliant with treatment all of the time. He does not see a podiatrist.Eye exam current: Appointment in June.   Results for orders placed or performed in visit on 11/21/19  Hemoglobin A1c  Result Value Ref Range   Hgb A1c MFr Bld 7.3 (H) 4.8 - 5.6 %   Est. average glucose Bld gHb Est-mCnc 163 mg/dL  Lipid panel  Result Value Ref Range   Cholesterol, Total 155 100 - 199 mg/dL   Triglycerides 184 (H) 0 - 149 mg/dL   HDL 35 (L) >39 mg/dL   VLDL Cholesterol Cal 32 5 - 40 mg/dL   LDL Chol Calc (NIH) 88 0 - 99 mg/dL   Chol/HDL Ratio 4.4 0.0 - 5.0 ratio  Hepatic function panel  Result Value Ref Range   Total Protein 6.5 6.0 - 8.5 g/dL   Albumin 4.5 3.8 - 4.8 g/dL   Bilirubin Total 0.6 0.0 - 1.2 mg/dL   Bilirubin, Direct 0.17 0.00 - 0.40 mg/dL   Alkaline Phosphatase 68 48 - 121 IU/L   AST 30 0 - 40 IU/L   ALT 38 0 - 44 IU/L    Patient has had a couple episodes of hypoglycemia.  An attempt not to skip a meal.  Watching diet regularly.  Reduce Metformin has helped his stool pattern  Compliant with blood pressure and lipid medication  Blood pressure medicine and blood pressure levels reviewed today with patient. Compliant with blood pressure medicine. States does not miss a dose. No obvious side effects. Blood pressure generally good when checked elsewhere. Watching salt intake.   Review of Systems No headache no chest pain no shortness of breath    Objective:   Physical Exam  Alert vitals stable, NAD. Blood pressure good on repeat. HEENT normal. Lungs clear. Heart regular rate and rhythm.       Assessment & Plan:  Impression type 2 diabetes.  Good control with A1c at 7.3 we will switch  glipizide to XL formulation to try to diminish flow sugar spells.  Rationale discussed.  2.  Hypertension hyperlipidemia good control maintain meds  Follow-up in 6 months diet exercise discussed

## 2019-11-29 ENCOUNTER — Telehealth: Payer: Self-pay | Admitting: Family Medicine

## 2019-11-29 NOTE — Telephone Encounter (Signed)
Should be written on the bottle two qam, this was changed to lower the risk of low sugar spells since it is absorbed more slowly, same dose as before but this morning dose lasts 24 hrs, no evening dosaging

## 2019-11-29 NOTE — Telephone Encounter (Signed)
Discussed with pt and he verbalized understanding.

## 2019-11-29 NOTE — Telephone Encounter (Signed)
Seen on the 24th and pt was taking glipizide 5mg  one bid. Changed at visit to extended release 2qam. Pt wanted to make sure he was suppose to take 2qam and one one qam.

## 2019-11-29 NOTE — Telephone Encounter (Signed)
Pt would like to confirm with Dr. Richardson Landry he is suppose to take two glipizide extended release in the AM. He thought he was told only one tablet in the morning but pharmacy told him two and just wants to make sure that is correct.

## 2020-03-03 ENCOUNTER — Ambulatory Visit: Payer: 59 | Admitting: Orthopedic Surgery

## 2020-03-03 ENCOUNTER — Ambulatory Visit: Payer: 59

## 2020-03-03 ENCOUNTER — Other Ambulatory Visit: Payer: Self-pay

## 2020-03-03 ENCOUNTER — Encounter: Payer: Self-pay | Admitting: Orthopedic Surgery

## 2020-03-03 VITALS — BP 155/99 | HR 97 | Ht 74.0 in | Wt 270.0 lb

## 2020-03-03 DIAGNOSIS — M65342 Trigger finger, left ring finger: Secondary | ICD-10-CM

## 2020-03-03 DIAGNOSIS — M79642 Pain in left hand: Secondary | ICD-10-CM

## 2020-03-03 NOTE — Patient Instructions (Addendum)
You have received an injection of steroids into the joint. 15% of patients will have increased pain within the 24 hours postinjection.   This is transient and will go away.   We recommend that you use ice packs on the injection site for 20 minutes every 2 hours and extra strength Tylenol 2 tablets every 8 as needed until the pain resolves.  If you continue to have pain after taking the Tylenol and using the ice please call the office for further instructions.  Trigger Finger  Trigger finger, also called stenosing tenosynovitis,  is a condition that causes a finger to get stuck in a bent position. Each finger has a tendon, which is a tough, cord-like tissue that connects muscle to bone, and each tendon passes through a tunnel of tissue called a tendon sheath. To move your finger, your tendon needs to glide freely through the sheath. Trigger finger happens when the tendon or the sheath thickens, making it difficult to move your finger. Trigger finger can affect any finger or a thumb. It may affect more than one finger. Mild cases may clear up with rest and medicine. Severe cases require more treatment. What are the causes? Trigger finger is caused by a thickened finger tendon or tendon sheath. The cause of this thickening is not known. What increases the risk? The following factors may make you more likely to develop this condition:  Doing activities that require a strong grip.  Having rheumatoid arthritis, gout, or diabetes.  Being 55-70 years old.  Being male. What are the signs or symptoms? Symptoms of this condition include:  Pain when bending or straightening your finger.  Tenderness or swelling where your finger attaches to the palm of your hand.  A lump in the palm of your hand or on the inside of your finger.  Hearing a noise like a pop or a snap when you try to straighten your finger.  Feeling a catching or locking sensation when you try to straighten your finger.  Being  unable to straighten your finger. How is this diagnosed? This condition is diagnosed based on your symptoms and a physical exam. How is this treated? This condition may be treated by:  Resting your finger and avoiding activities that make symptoms worse.  Wearing a finger splint to keep your finger extended.  Taking NSAIDs, such as ibuprofen, to relieve pain and swelling.  Doing gentle exercises to stretch the finger as told by your health care provider.  Having medicine that reduces swelling and inflammation (steroids) injected into the tendon sheath. Injections may need to be repeated.  Having surgery to open the tendon sheath. This may be done if other treatments do not work and you cannot straighten your finger. You may need physical therapy after surgery. Follow these instructions at home: If you have a splint:  Wear the splint as told by your health care provider. Remove it only as told by your health care provider.  Loosen it if your fingers tingle, become numb, or turn cold and blue.  Keep it clean.  If the splint is not waterproof: ? Do not let it get wet. ? Cover it with a watertight covering when you take a bath or shower. Managing pain, stiffness, and swelling     If directed, apply heat to the affected area as often as told by your health care provider. Use the heat source that your health care provider recommends, such as a moist heat pack or a heating pad.  Place  a towel between your skin and the heat source.  Leave the heat on for 20-30 minutes.  Remove the heat if your skin turns bright red. This is especially important if you are unable to feel pain, heat, or cold. You may have a greater risk of getting burned. If directed, put ice on the painful area. To do this:  If you have a removable splint, remove it as told by your health care provider.  Put ice in a plastic bag.  Place a towel between your skin and the bag or between your splint and the  bag.  Leave the ice on for 20 minutes, 2-3 times a day.  Activity  Rest your finger as told by your health care provider. Avoid activities that make the pain worse.  Return to your normal activities as told by your health care provider. Ask your health care provider what activities are safe for you.  Do exercises as told by your health care provider.  Ask your health care provider when it is safe to drive if you have a splint on your hand. General instructions  Take over-the-counter and prescription medicines only as told by your health care provider.  Keep all follow-up visits as told by your health care provider. This is important. Contact a health care provider if:  Your symptoms are not improving with home care. Summary  Trigger finger, also called stenosing tenosynovitis, causes your finger to get stuck in a bent position. This can make it difficult and painful to straighten your finger.  This condition develops when a finger tendon or tendon sheath thickens.  Treatment may include resting your finger, wearing a splint, and taking medicines.  In severe cases, surgery to open the tendon sheath may be needed. This information is not intended to replace advice given to you by your health care provider. Make sure you discuss any questions you have with your health care provider. Document Revised: 11/06/2018 Document Reviewed: 11/06/2018 Elsevier Patient Education  [place].

## 2020-03-03 NOTE — Progress Notes (Signed)
NEW PROBLEM//OFFICE VISIT  Chief Complaint  Patient presents with   Hand Pain    left hand, for about 1 year, fingers get numb feeling, comes and goes, no injury,     64 year old male now retired [employer] presents with a 1 year history of pain over the A1 pulley of the left ring finger with occasional numbness in the hands has a history of diabetes  Review of Systems  All other systems reviewed and are negative.    Past Medical History:  Diagnosis Date   Atypical nevus 07/30/2010   Left Upper Paraspinal-Moderate to Severe (w/s widershave)   BCC (basal cell carcinoma of skin) 07/30/2010   Left Side of Nose   BCC (basal cell carcinoma of skin) 03/26/2014   Left Cheek (Cx3,5FU)   COPD (chronic obstructive pulmonary disease) ([facility])    GERD (gastroesophageal reflux disease)    History of basal cell carcinoma (BCC) excision    07-31-2018 x2  cheek area   Hyperlipidemia    Hypertension    Hyperthyroidism    endocrinologist-- dr Crissie Sickles (note in epic)   Nocturia    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   Left Sideburn (Cx3,5FU)   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   Left Cheek (Cx3,5FU)   Nodular basal cell carcinoma (BCC) 12/14/2018   Right Deltoid Post (tx p bx)   OA (osteoarthritis)    Thyroid adenoma, toxic    right side   Type 2 diabetes mellitus ([place])    followed by pcp   Wears contact lenses     Past Surgical History:  Procedure Laterality Date   COLONOSCOPY  02/29/2012   Procedure: COLONOSCOPY;  Surgeon: Jamesetta So, MD;  [facility];  Service: Gastroenterology;  Laterality: N/A;   COLONOSCOPY N/A 01/03/2018   Procedure: COLONOSCOPY;  Surgeon: Aviva Signs, MD;  [facility];  Service: Gastroenterology;  Laterality: N/A;   KNEE ARTHROSCOPY Right 04-25-2009  dr Aline Brochure $RemoveBefore'@[facility][email or web address]$    LAPAROSCOPIC CHOLECYSTECTOMY  11-07-2004   dr Arnoldo Morale $RemoveBefore'@[facility][email or web address]$    THYROID LOBECTOMY Right 08/15/2018   Procedure: RIGHT THYROID  LOBECTOMY;  Surgeon: Armandina Gemma, MD;  [facility]: [facility];  Service: General;  Laterality: Right;    Family History  Problem Relation Age of Onset   Hypertension Mother    Heart disease Father    Cancer Father        bladder   Cancer Brother        throat   Colon cancer Neg Hx    Anesthesia problems Neg Hx    Social History   Tobacco Use   Smoking status: Former Smoker    Years: 35.00    Types: Cigarettes    Quit date: 12/09/2006    Years since quitting: 13.2   Smokeless tobacco: Never Used  Vaping Use   Vaping Use: Never used  Substance Use Topics   Alcohol use: Yes    Comment: occasional   Drug use: Never    No Known Allergies  Current Meds  Medication Sig   ACCU-CHEK GUIDE test strip USE 1 STRIP TO CHECK GLUCOSE ONCE DAILY   blood glucose meter kit and supplies Dispense based on patient and insurance preference. Use to check blood sugar once daily. For ICD- 10  E11.9   esomeprazole (NEXIUM) 40 MG capsule TAKE ONE CAPSULE BY MOUTH ONCE DAILY BEFORE BREAKFAST   glipiZIDE (GLUCOTROL XL) 5 MG 24 hr tablet Take 2 tablets Q AM   levothyroxine (SYNTHROID) 50 MCG tablet Take 1  tablet (50 mcg total) by mouth daily.   lisinopril (ZESTRIL) 5 MG tablet TAKE 1 TABLET BY MOUTH EVERY DAY AT BEDTIME   metFORMIN (GLUCOPHAGE) 500 MG tablet Take one po bid   simvastatin (ZOCOR) 20 MG tablet Take 1 tablet (20 mg total) by mouth daily with breakfast.    BP (!) 155/99    Pulse 97    Ht $R'6\' 2"'yX$  (1.88 m)    Wt 270 lb (122.5 kg)    BMI 34.67 kg/m   Physical Exam Constitutional:      General: He is not in acute distress.    Appearance: He is well-developed.  Cardiovascular:     Comments: No peripheral edema Skin:    General: Skin is warm and dry.  Neurological:     Mental Status: He is alert and oriented to person, place, and time.     Sensory: No sensory deficit.     Coordination: Coordination normal.     Gait: Gait normal.     Deep Tendon Reflexes: Reflexes are  normal and symmetric.     Ortho Exam Normal range of motion left hand strength is normal color capillary refill normal skin normal tenderness over A1 pulley without catching locking left ring finger crush no palpable cords    MEDICAL DECISION MAKING  A.  Encounter Diagnoses  Name Primary?   Pain in left hand    Trigger finger, left ring finger Yes    B. DATA ANALYSED:   IMAGING: Interpretation of images: Internal images arthritis of the wrist none in the hand Orders:   Outside records reviewed:    C. MANAGEMENT   Injection  Trigger finger injection  Diagnosis tenosynovitis A1 pulley left ring finger Procedure injection A1 pulley Medications lidocaine 1% 1 mL and Depo-Medrol 40 mg 1 mL Skin prep alcohol and ethyl chloride Verbal consent was obtained Timeout confirmed the injection site  After cleaning the skin with alcohol and anesthetizing the skin with ethyl chloride the A1 pulley was palpated and the injection was performed without complication  No orders of the defined types were placed in this encounter.     Arther Abbott, MD  03/03/2020 3:35 PM

## 2020-03-13 ENCOUNTER — Other Ambulatory Visit: Payer: Self-pay

## 2020-03-13 ENCOUNTER — Ambulatory Visit
Admission: EM | Admit: 2020-03-13 | Discharge: 2020-03-13 | Disposition: A | Discharge status: Home / Self Care | Payer: 59 | Attending: Emergency Medicine | Admitting: Emergency Medicine

## 2020-03-13 DIAGNOSIS — J069 Acute upper respiratory infection, unspecified: Secondary | ICD-10-CM | POA: Diagnosis not present

## 2020-03-13 DIAGNOSIS — Z1152 Encounter for screening for COVID-19: Secondary | ICD-10-CM

## 2020-03-13 MED ORDER — PREDNISONE 20 MG PO TABS: 20.0000 mg | ORAL_TABLET | Freq: Two times a day (BID) | ORAL | 0 refills | Status: AC

## 2020-03-13 MED ORDER — AMOXICILLIN-POT CLAVULANATE 875-125 MG PO TABS: 1.0000 | ORAL_TABLET | Freq: Two times a day (BID) | ORAL | 0 refills | Status: DC

## 2020-03-13 MED ORDER — DM-GUAIFENESIN ER 30-600 MG PO TB12: 1.0000 | ORAL_TABLET | Freq: Two times a day (BID) | ORAL | 0 refills | Status: DC

## 2020-03-13 MED ORDER — FLUTICASONE PROPIONATE 50 MCG/ACT NA SUSP: 1.0000 | Freq: Every day | NASAL | 0 refills | Status: DC

## 2020-03-13 MED ORDER — BENZONATATE 200 MG PO CAPS: 200.0000 mg | ORAL_CAPSULE | Freq: Three times a day (TID) | ORAL | 0 refills | Status: AC | PRN

## 2020-03-13 NOTE — Discharge Instructions (Addendum)
Covid test pending, monitor my chart for results Please use Tessalon/benzonatate every 8 hours as needed for cough Mucinex DM twice daily for congestion/cough Flonase nasal spray 1 to 2 spray in each nostril daily for sinus pressure and congestion Prednisone 20 mg twice daily with food for the next 4 days, monitor sugars while taking, if sugars spiking please stop taking Rest and drink plenty of fluids If not seeing any improvement in symptoms on Monday may fill prescription for Augmentin to treat sinus infection Follow-up for any concerns or symptoms not improving or worsening

## 2020-03-13 NOTE — ED Triage Notes (Signed)
Pt presents with nasal congestion and cough  That developed on Tuesday, pt had rapid test done yesterday  and was negative

## 2020-03-13 NOTE — ED Provider Notes (Signed)
[facility]    CSN: [id number] Arrival date & time: 03/13/20  1324      History   Chief Complaint Chief Complaint  Patient presents with  . Nasal Congestion    HPI Cristian Nguyen is a 64 y.o. male presenting today for evaluation of URI symptoms.  Patient reports symptoms began on Tuesday approximately 2 to 3 days ago.  He has had cough congestion a lot of mucus production.  Reports mucus is green.  Low-grade fevers of 100 with some body aches.  Has had some slight shortness of breath, but denies any chest pain.  Has had rapid Covid test which was negative.  Recently traveled to [place] for coughing.  Has not tried any over-the-counter medicines.  Denies history of asthma or tobacco use.   HPI  Past Medical History:  Diagnosis Date  . Atypical nevus 07/30/2010   Left Upper Paraspinal-Moderate to Severe (w/s widershave)  . BCC (basal cell carcinoma of skin) 07/30/2010   Left Side of Nose  . BCC (basal cell carcinoma of skin) 03/26/2014   Left Cheek (Cx3,5FU)  . COPD (chronic obstructive pulmonary disease) ([place])   . GERD (gastroesophageal reflux disease)   . History of basal cell carcinoma (BCC) excision    07-31-2018 x2  cheek area  . Hyperlipidemia   . Hypertension   . Hyperthyroidism    endocrinologist-- dr Crissie Sickles (note in epic)  . Nocturia   . 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   Left Sideburn (Cx3,5FU)  . 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   Left Cheek (Cx3,5FU)  . Nodular basal cell carcinoma (BCC) 12/14/2018   Right Deltoid Post (tx p bx)  . OA (osteoarthritis)   . Thyroid adenoma, toxic    right side  . Type 2 diabetes mellitus ([place])    followed by pcp  . Wears contact lenses     Patient Active Problem List   Diagnosis Date Noted  . S/P subtotal thyroidectomy 09/01/2018  . Toxic adenoma 08/15/2018  . 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 Thyroid nodule  05/24/2018  . Hyperthyroidism 05/24/2018  . Rectal polyp   . History of colon polyps   . Type 2 diabetes mellitus without complication ([place]) 40/04/2724  . Other and unspecified hyperlipidemia 05/08/2013  . Esophageal reflux 05/08/2013  . Essential hypertension, benign 05/08/2013  . Impaired fasting glucose 05/08/2013  . KNEE, ARTHRITIS, DEGEN./OSTEO 04/29/2009  . DERANGEMENT OF POSTERIOR HORN OF MEDIAL MENISCUS 03/24/2009    Past Surgical History:  Procedure Laterality Date  . COLONOSCOPY  02/29/2012   Procedure: COLONOSCOPY;  Surgeon: Jamesetta So, MD;  Location: [facility];  Service: Gastroenterology;  Laterality: N/A;  . COLONOSCOPY N/A 01/03/2018   Procedure: COLONOSCOPY;  Surgeon: Aviva Signs, MD;  Location: [facility];  Service: Gastroenterology;  Laterality: N/A;  . KNEE ARTHROSCOPY Right 04-25-2009  dr Aline Brochure _0   . LAPAROSCOPIC CHOLECYSTECTOMY  11-07-2004   dr Arnoldo Morale _1   . THYROID LOBECTOMY Right 08/15/2018   Procedure: RIGHT THYROID LOBECTOMY;  Surgeon: Armandina Gemma, MD;  Location: [facility];  Service: General;  Laterality: Right;       Home Medications    Prior to Admission medications   Medication Sig Start Date End Date Taking? Authorizing Provider  ACCU-CHEK GUIDE test strip USE 1 STRIP TO CHECK GLUCOSE ONCE DAILY 09/17/19   Mikey Kirschner, MD  amoxicillin-clavulanate (AUGMENTIN) 875-125 MG tablet Take 1 tablet by mouth every 12 (  twelve) hours. 03/17/20   Harper Smoker C, PA-C  benzonatate (TESSALON) 200 MG capsule Take 1 capsule (200 mg total) by mouth 3 (three) times daily as needed for up to 7 days for cough. 03/13/20 03/20/20  Camera Krienke C, PA-C  blood glucose meter kit and supplies Dispense based on patient and insurance preference. Use to check blood sugar once daily. For ICD- 10  E11.9 05/29/19   Mikey Kirschner, MD  dextromethorphan-guaiFENesin [facility] DM) 30-600 MG 12hr tablet Take 1 tablet by mouth 2 (two) times daily. 03/13/20   Christophr Calix,  Jerolyn Flenniken C, PA-C  esomeprazole (NEXIUM) 40 MG capsule TAKE ONE CAPSULE BY MOUTH ONCE DAILY BEFORE BREAKFAST 11/26/19   Mikey Kirschner, MD  fluticasone [facility]) 50 MCG/ACT nasal spray Place 1-2 sprays into both nostrils daily. 03/13/20   Sheilah Rayos C, PA-C  glipiZIDE (GLUCOTROL XL) 5 MG 24 hr tablet Take 2 tablets Q AM 11/26/19   Mikey Kirschner, MD  levothyroxine (SYNTHROID) 50 MCG tablet Take 1 tablet (50 mcg total) by mouth daily. 06/19/19   Shamleffer, Melanie Crazier, MD  lisinopril (ZESTRIL) 5 MG tablet TAKE 1 TABLET BY MOUTH EVERY DAY AT BEDTIME 11/26/19   Mikey Kirschner, MD  metFORMIN (GLUCOPHAGE) 500 MG tablet Take one po bid 11/26/19   Mikey Kirschner, MD  predniSONE (DELTASONE) 20 MG tablet Take 1 tablet (20 mg total) by mouth 2 (two) times daily with a meal for 4 days. 03/13/20 03/17/20  Cinderella Christoffersen C, PA-C  simvastatin (ZOCOR) 20 MG tablet Take 1 tablet (20 mg total) by mouth daily with breakfast. 11/26/19   Mikey Kirschner, MD    Family History Family History  Problem Relation Age of Onset  . Hypertension Mother   . Heart disease Father   . Cancer Father        bladder  . Cancer Brother        throat  . Colon cancer Neg Hx   . Anesthesia problems Neg Hx     Social History Social History   Tobacco Use  . Smoking status: Former Smoker    Years: 35.00    Types: Cigarettes    Quit date: 12/09/2006    Years since quitting: 13.2  . Smokeless tobacco: Never Used  Vaping Use  . Vaping Use: Never used  Substance Use Topics  . Alcohol use: Yes    Comment: occasional  . Drug use: Never     Allergies   Patient has no known allergies.   Review of Systems Review of Systems  Constitutional: Positive for fatigue and fever. Negative for activity change, appetite change and chills.  HENT: Positive for congestion, rhinorrhea and sinus pressure. Negative for ear pain, sore throat and trouble swallowing.   Eyes: Negative for discharge and redness.  Respiratory:  Positive for cough. Negative for chest tightness and shortness of breath.   Cardiovascular: Negative for chest pain.  Gastrointestinal: Negative for abdominal pain, diarrhea, nausea and vomiting.  Musculoskeletal: Positive for myalgias.  Skin: Negative for rash.  Neurological: Negative for dizziness, light-headedness and headaches.     Physical Exam Triage Vital Signs ED Triage Vitals  Enc Vitals Group     BP 03/13/20 1009 (!) 138/95     Pulse Rate 03/13/20 1009 88     Resp 03/13/20 1009 20     Temp 03/13/20 1009 98.2 F (36.8 C)     Temp src --      SpO2 03/13/20 1009 93 %  Weight --      Height --      Head Circumference --      Peak Flow --      Pain Score 03/13/20 1007 4     Pain Loc --      Pain Edu? --      Excl. in [place]? --    No data found.  Updated Vital Signs BP (!) 138/95   Pulse 88   Temp 98.2 F (36.8 C)   Resp 20   SpO2 93%   Visual Acuity Right Eye Distance:   Left Eye Distance:   Bilateral Distance:    Right Eye Near:   Left Eye Near:    Bilateral Near:     Physical Exam Vitals and nursing note reviewed.  Constitutional:      Appearance: He is well-developed.     Comments: No acute distress  HENT:     Head: Normocephalic and atraumatic.     Ears:     Comments: Bilateral effusion, clear, no erythema    Nose: Nose normal.     Mouth/Throat:     Comments: Oral mucosa pink and moist, no tonsillar enlargement or exudate. Posterior pharynx patent and nonerythematous, no uvula deviation or swelling. Normal phonation. Eyes:     Conjunctiva/sclera: Conjunctivae normal.  Cardiovascular:     Rate and Rhythm: Normal rate.  Pulmonary:     Effort: Pulmonary effort is normal. No respiratory distress.     Comments: Faint expiratory wheezing with mildly coarse breath sounds throughout bilateral lung fields, no crackles Abdominal:     General: There is no distension.  Musculoskeletal:        General: Normal range of motion.     Cervical back: Neck  supple.  Skin:    General: Skin is warm and dry.  Neurological:     Mental Status: He is alert and oriented to person, place, and time.      UC Treatments / Results  Labs (all labs ordered are listed, but only abnormal results are displayed) Labs Reviewed  NOVEL CORONAVIRUS, NAA    EKG   Radiology No results found.  Procedures Procedures (including critical care time)  Medications Ordered in UC Medications - No data to display  Initial Impression / Assessment and Plan / UC Course  I have reviewed the triage vital signs and the nursing notes.  Pertinent labs & imaging results that were available during my care of the patient were reviewed by me and considered in my medical decision making (see chart for details).     Covid test pending.  2 to 3 days of URI symptoms, vital signs stable, suspect likely viral etiology and recommend symptomatic and supportive care rest and fluids.  Providing course of prednisone given sinus pressure and wheezing noted in lungs, patient is diabetic, advised to monitor sugars and stop taking if sugar spiking significantly.  May fill prescription for Augmentin on Monday if not seeing any improvement with symptomatic and supportive care for the next 4 to 5 days.  Discussed strict return precautions. Patient verbalized understanding and is agreeable with plan.  Final Clinical Impressions(s) / UC Diagnoses   Final diagnoses:  Viral URI with cough     Discharge Instructions     Covid test pending, monitor my chart for results Please use Tessalon/benzonatate every 8 hours as needed for cough Mucinex DM twice daily for congestion/cough Flonase nasal spray 1 to 2 spray in each nostril daily for sinus pressure and congestion  Prednisone 20 mg twice daily with food for the next 4 days, monitor sugars while taking, if sugars spiking please stop taking Rest and drink plenty of fluids If not seeing any improvement in symptoms on Monday may fill  prescription for Augmentin to treat sinus infection Follow-up for any concerns or symptoms not improving or worsening    ED Prescriptions    Medication Sig Dispense Auth. Provider   predniSONE (DELTASONE) 20 MG tablet Take 1 tablet (20 mg total) by mouth 2 (two) times daily with a meal for 4 days. 8 tablet Ulys Favia C, PA-C   fluticasone (FLONASE) 50 MCG/ACT nasal spray Place 1-2 sprays into both nostrils daily. 16 g Ming Kunka C, PA-C   benzonatate (TESSALON) 200 MG capsule Take 1 capsule (200 mg total) by mouth 3 (three) times daily as needed for up to 7 days for cough. 28 capsule Elienai Gailey C, PA-C   dextromethorphan-guaiFENesin (MUCINEX DM) 30-600 MG 12hr tablet Take 1 tablet by mouth 2 (two) times daily. 20 tablet Sailor Hevia C, PA-C   amoxicillin-clavulanate (AUGMENTIN) 875-125 MG tablet Take 1 tablet by mouth every 12 (twelve) hours. 14 tablet Edia Pursifull, [place] C, PA-C     PDMP not reviewed this encounter.   Dellamae Rosamilia, [place] C, PA-C 03/13/20 1101

## 2020-03-15 LAB — NOVEL CORONAVIRUS, NAA: SARS-CoV-2, NAA: NOT DETECTED

## 2020-03-15 LAB — SARS-COV-2, NAA 2 DAY TAT

## 2020-03-21 ENCOUNTER — Ambulatory Visit
Admission: EM | Admit: 2020-03-21 | Discharge: 2020-03-21 | Disposition: A | Discharge status: Home / Self Care | Payer: 59 | Attending: Emergency Medicine | Admitting: Emergency Medicine

## 2020-03-21 ENCOUNTER — Other Ambulatory Visit: Payer: Self-pay

## 2020-03-21 DIAGNOSIS — Z1152 Encounter for screening for COVID-19: Secondary | ICD-10-CM

## 2020-03-24 LAB — NOVEL CORONAVIRUS, NAA: SARS-CoV-2, NAA: NOT DETECTED

## 2020-05-26 ENCOUNTER — Other Ambulatory Visit: Payer: Self-pay

## 2020-05-26 MED ORDER — GLIPIZIDE ER 5 MG PO TB24
ORAL_TABLET | ORAL | 0 refills | Status: DC
Start: 2020-05-26 — End: 2020-06-16

## 2020-05-26 MED ORDER — SIMVASTATIN 20 MG PO TABS
20.0000 mg | ORAL_TABLET | Freq: Every day | ORAL | 0 refills | Status: DC
Start: 2020-05-26 — End: 2020-06-16

## 2020-05-26 MED ORDER — ESOMEPRAZOLE MAGNESIUM 40 MG PO CPDR
DELAYED_RELEASE_CAPSULE | ORAL | 0 refills | Status: DC
Start: 2020-05-26 — End: 2020-06-16

## 2020-05-26 MED ORDER — METFORMIN HCL 500 MG PO TABS
ORAL_TABLET | ORAL | 0 refills | Status: DC
Start: 2020-05-26 — End: 2020-06-16

## 2020-05-26 MED ORDER — LISINOPRIL 5 MG PO TABS
ORAL_TABLET | ORAL | 0 refills | Status: DC
Start: 2020-05-26 — End: 2020-06-16

## 2020-05-26 NOTE — Telephone Encounter (Signed)
Last labs 11/23/19: Lipid, Liver, HgbA1c  Meds pended

## 2020-05-26 NOTE — Telephone Encounter (Signed)
Pt need refill on all Medications has med check appt 12/7 also will pt need blood work?  Pt call back [phone]

## 2020-06-04 ENCOUNTER — Telehealth: Payer: Self-pay

## 2020-06-04 DIAGNOSIS — Z79899 Other long term (current) drug therapy: Secondary | ICD-10-CM

## 2020-06-04 DIAGNOSIS — E785 Hyperlipidemia, unspecified: Secondary | ICD-10-CM

## 2020-06-04 DIAGNOSIS — E119 Type 2 diabetes mellitus without complications: Secondary | ICD-10-CM

## 2020-06-04 DIAGNOSIS — I10 Essential (primary) hypertension: Secondary | ICD-10-CM

## 2020-06-04 NOTE — Telephone Encounter (Signed)
Patient has medication follow up on 12/7 and wanting labs done.

## 2020-06-04 NOTE — Telephone Encounter (Signed)
Last labs 11/23/19: Lipid, Liver, HgbA1c

## 2020-06-04 NOTE — Telephone Encounter (Signed)
Yes, pls order cbc, cmp, lipids, a1c, urine microalbumin. Thx, dr. Lovena Le

## 2020-06-05 ENCOUNTER — Other Ambulatory Visit: Payer: Self-pay | Admitting: Family Medicine

## 2020-06-05 DIAGNOSIS — Z79899 Other long term (current) drug therapy: Secondary | ICD-10-CM

## 2020-06-05 DIAGNOSIS — E785 Hyperlipidemia, unspecified: Secondary | ICD-10-CM

## 2020-06-05 DIAGNOSIS — E119 Type 2 diabetes mellitus without complications: Secondary | ICD-10-CM

## 2020-06-05 DIAGNOSIS — I10 Essential (primary) hypertension: Secondary | ICD-10-CM

## 2020-06-06 LAB — CBC WITH DIFFERENTIAL/PLATELET
Basophils Absolute: 0.1 10*3/uL (ref 0.0–0.2)
Basos: 1 %
EOS (ABSOLUTE): 0.1 10*3/uL (ref 0.0–0.4)
Eos: 1 %
Hematocrit: 44.6 % (ref 37.5–51.0)
Hemoglobin: 15.5 g/dL (ref 13.0–17.7)
Immature Grans (Abs): 0.1 10*3/uL (ref 0.0–0.1)
Immature Granulocytes: 1 %
Lymphocytes Absolute: 2.7 10*3/uL (ref 0.7–3.1)
Lymphs: 35 %
MCH: 30.3 pg (ref 26.6–33.0)
MCHC: 34.8 g/dL (ref 31.5–35.7)
MCV: 87 fL (ref 79–97)
Monocytes Absolute: 0.5 10*3/uL (ref 0.1–0.9)
Monocytes: 7 %
Neutrophils Absolute: 4.3 10*3/uL (ref 1.4–7.0)
Neutrophils: 55 %
Platelets: 213 10*3/uL (ref 150–450)
RBC: 5.12 x10E6/uL (ref 4.14–5.80)
RDW: 12.2 % (ref 11.6–15.4)
WBC: 7.6 10*3/uL (ref 3.4–10.8)

## 2020-06-06 LAB — COMPREHENSIVE METABOLIC PANEL
ALT: 37 IU/L (ref 0–44)
AST: 34 IU/L (ref 0–40)
Albumin/Globulin Ratio: 1.9 (ref 1.2–2.2)
Albumin: 4.4 g/dL (ref 3.8–4.8)
Alkaline Phosphatase: 71 IU/L (ref 44–121)
BUN/Creatinine Ratio: 12 (ref 10–24)
BUN: 12 mg/dL (ref 8–27)
Bilirubin Total: 0.7 mg/dL (ref 0.0–1.2)
CO2: 25 mmol/L (ref 20–29)
Calcium: 9.2 mg/dL (ref 8.6–10.2)
Chloride: 98 mmol/L (ref 96–106)
Creatinine, Ser: 0.98 mg/dL (ref 0.76–1.27)
GFR calc Af Amer: 94 mL/min/{1.73_m2} (ref >59)
GFR calc non Af Amer: 82 mL/min/{1.73_m2} (ref >59)
Globulin, Total: 2.3 g/dL (ref 1.5–4.5)
Glucose: 175 mg/dL — ABNORMAL HIGH (ref 65–99)
Potassium: 4.4 mmol/L (ref 3.5–5.2)
Sodium: 139 mmol/L (ref 134–144)
Total Protein: 6.7 g/dL (ref 6.0–8.5)

## 2020-06-06 LAB — LIPID PANEL
Chol/HDL Ratio: 5 ratio (ref 0.0–5.0)
Cholesterol, Total: 180 mg/dL (ref 100–199)
HDL: 36 mg/dL — ABNORMAL LOW (ref >39)
LDL Chol Calc (NIH): 99 mg/dL (ref 0–99)
Triglycerides: 263 mg/dL — ABNORMAL HIGH (ref 0–149)
VLDL Cholesterol Cal: 45 mg/dL — ABNORMAL HIGH (ref 5–40)

## 2020-06-06 LAB — MICROALBUMIN / CREATININE URINE RATIO
Creatinine, Urine: 241.9 mg/dL
Microalb/Creat Ratio: 12 mg/g creat (ref 0–29)
Microalbumin, Urine: 30.1 ug/mL

## 2020-06-06 LAB — HEMOGLOBIN A1C
Est. average glucose Bld gHb Est-mCnc: 200 mg/dL
Hgb A1c MFr Bld: 8.6 % — ABNORMAL HIGH (ref 4.8–5.6)

## 2020-06-06 NOTE — Telephone Encounter (Signed)
Blood work ordered in Epic. Left message to return call to notify patient. 

## 2020-06-06 NOTE — Telephone Encounter (Signed)
Pt returned call and verbalized understanding. Pt had labs completed yesterday.

## 2020-06-10 ENCOUNTER — Ambulatory Visit: Payer: 59 | Admitting: Family Medicine

## 2020-06-16 ENCOUNTER — Other Ambulatory Visit: Payer: Self-pay

## 2020-06-16 ENCOUNTER — Encounter: Payer: Self-pay | Admitting: Family Medicine

## 2020-06-16 ENCOUNTER — Ambulatory Visit: Payer: 59 | Admitting: Family Medicine

## 2020-06-16 VITALS — BP 138/88 | HR 86 | Temp 97.7°F | Ht 74.0 in | Wt 271.2 lb

## 2020-06-16 DIAGNOSIS — E119 Type 2 diabetes mellitus without complications: Secondary | ICD-10-CM | POA: Diagnosis not present

## 2020-06-16 DIAGNOSIS — Z125 Encounter for screening for malignant neoplasm of prostate: Secondary | ICD-10-CM

## 2020-06-16 DIAGNOSIS — E890 Postprocedural hypothyroidism: Secondary | ICD-10-CM

## 2020-06-16 DIAGNOSIS — E041 Nontoxic single thyroid nodule: Secondary | ICD-10-CM | POA: Diagnosis not present

## 2020-06-16 DIAGNOSIS — I10 Essential (primary) hypertension: Secondary | ICD-10-CM | POA: Diagnosis not present

## 2020-06-16 MED ORDER — SIMVASTATIN 20 MG PO TABS: 20.0000 mg | ORAL_TABLET | Freq: Every day | ORAL | 1 refills | Status: DC

## 2020-06-16 MED ORDER — GLIPIZIDE ER 5 MG PO TB24: ORAL_TABLET | ORAL | 1 refills | Status: DC

## 2020-06-16 MED ORDER — LISINOPRIL 5 MG PO TABS: 5.0000 mg | ORAL_TABLET | Freq: Two times a day (BID) | ORAL | 1 refills | Status: DC

## 2020-06-16 MED ORDER — LEVOTHYROXINE SODIUM 50 MCG PO TABS: 50.0000 ug | ORAL_TABLET | Freq: Every day | ORAL | 1 refills | Status: DC

## 2020-06-16 MED ORDER — METFORMIN HCL 850 MG PO TABS: 850.0000 mg | ORAL_TABLET | Freq: Two times a day (BID) | ORAL | 5 refills | Status: DC

## 2020-06-16 MED ORDER — ESOMEPRAZOLE MAGNESIUM 40 MG PO CPDR: DELAYED_RELEASE_CAPSULE | ORAL | 1 refills | Status: DC

## 2020-06-16 NOTE — Progress Notes (Signed)
Patient ID: Cristian Nguyen, male    DOB: 20-Sep-1955, 64 y.o.   MRN: [id number]   Chief Complaint  Patient presents with  . Diabetes   Subjective:    HPI  Pt here for med check on DM and HTN. Pt doing well on all meds. Taking meds as directed. HTN has been doing well;sometime elevated but most time decent.   Pt seen for f/u for DM2. Checking high at home, 140-150s.  170-180s.  a1c went up to 8.6.  In 5/21- was at 7.3.  Drinking sodas.  occ eating sweets.   Seen eye doctor 9/21.  Wearing contacts. Per pt no retinopathy. Seeing My eye doctor in [place].  Coughing, was productive. Has improved and not seeing colored sputum. Drainage from night. Better over last few wks.  No fever.  No sore throat or runny nose.  Former smoker, quit smoking 10 yrs. Ago.  H/o thyrotoxicosis and had tumor on thyroid, has surgery 2019 on thyroid-partial removal now on synthroid.  Medical History Cristian Nguyen has a past medical history of Atypical nevus (07/30/2010), BCC (basal cell carcinoma of skin) (07/30/2010), BCC (basal cell carcinoma of skin) (03/26/2014), COPD (chronic obstructive pulmonary disease) ([place]), GERD (gastroesophageal reflux disease), History of basal cell carcinoma (BCC) excision, Hyperlipidemia, Hypertension, Hyperthyroidism, Nocturia, Nodular basal cell carcinoma (BCC) (07/31/2018), Nodular basal cell carcinoma (BCC) (07/31/2018), Nodular basal cell carcinoma (BCC) (12/14/2018), OA (osteoarthritis), Thyroid adenoma, toxic, Type 2 diabetes mellitus ([place]), and Wears contact lenses.   Outpatient Encounter Medications as of 06/16/2020  Medication Sig  . ACCU-CHEK GUIDE test strip USE 1 STRIP TO CHECK GLUCOSE ONCE DAILY  . blood glucose meter kit and supplies Dispense based on patient and insurance preference. Use to check blood sugar once daily. For ICD- 10  E11.9  . dextromethorphan-guaiFENesin (MUCINEX DM) 30-600 MG 12hr tablet Take 1 tablet by mouth 2 (two) times daily.  .  fluticasone (FLONASE) 50 MCG/ACT nasal spray Place 1-2 sprays into both nostrils daily.  . [DISCONTINUED] esomeprazole (NEXIUM) 40 MG capsule TAKE ONE CAPSULE BY MOUTH ONCE DAILY BEFORE BREAKFAST  . [DISCONTINUED] glipiZIDE (GLUCOTROL XL) 5 MG 24 hr tablet Take 2 tablets Q AM  . [DISCONTINUED] levothyroxine (SYNTHROID) 50 MCG tablet Take 1 tablet (50 mcg total) by mouth daily.  . [DISCONTINUED] lisinopril (ZESTRIL) 5 MG tablet TAKE 1 TABLET BY MOUTH EVERY DAY AT BEDTIME  . [DISCONTINUED] metFORMIN (GLUCOPHAGE) 500 MG tablet Take one po bid  . [DISCONTINUED] simvastatin (ZOCOR) 20 MG tablet Take 1 tablet (20 mg total) by mouth daily with breakfast.  . esomeprazole (NEXIUM) 40 MG capsule TAKE ONE CAPSULE BY MOUTH ONCE DAILY BEFORE BREAKFAST  . glipiZIDE (GLUCOTROL XL) 5 MG 24 hr tablet Take 2 tablets Q AM  . levothyroxine (SYNTHROID) 50 MCG tablet Take 1 tablet (50 mcg total) by mouth daily.  Marland Kitchen lisinopril (ZESTRIL) 5 MG tablet Take 1 tablet (5 mg total) by mouth 2 (two) times daily.  . metFORMIN (GLUCOPHAGE) 850 MG tablet Take 1 tablet (850 mg total) by mouth 2 (two) times daily with a meal. Take one po bid  . simvastatin (ZOCOR) 20 MG tablet Take 1 tablet (20 mg total) by mouth daily with breakfast.  . [DISCONTINUED] amoxicillin-clavulanate (AUGMENTIN) 875-125 MG tablet Take 1 tablet by mouth every 12 (twelve) hours.   No facility-administered encounter medications on file as of 06/16/2020.     Review of Systems  Constitutional: Negative for chills and fever.  HENT: Negative for congestion, rhinorrhea and sore throat.  Respiratory: Negative for cough, shortness of breath and wheezing.   Cardiovascular: Negative for chest pain and leg swelling.  Gastrointestinal: Negative for abdominal pain, diarrhea, nausea and vomiting.  Genitourinary: Negative for dysuria and frequency.  Skin: Negative for rash.  Neurological: Negative for dizziness, weakness and headaches.     Vitals BP 138/88    Pulse 86   Temp 97.7 F (36.5 C)   Ht _0  (1.88 m)   Wt 271 lb 3.2 oz (123 kg)   SpO2 97%   BMI 34.82 kg/m   Objective:   Physical Exam Vitals and nursing note reviewed.  Constitutional:      General: He is not in acute distress.    Appearance: Normal appearance. He is not ill-appearing.  HENT:     Head: Normocephalic.     Nose: Nose normal. No congestion.     Mouth/Throat:     Mouth: Mucous membranes are moist.     Pharynx: No oropharyngeal exudate.  Eyes:     Extraocular Movements: Extraocular movements intact.     Conjunctiva/sclera: Conjunctivae normal.     Pupils: Pupils are equal, round, and reactive to light.  Cardiovascular:     Rate and Rhythm: Normal rate and regular rhythm.     Pulses: Normal pulses.     Heart sounds: Normal heart sounds. No murmur heard.   Pulmonary:     Effort: Pulmonary effort is normal.     Breath sounds: Normal breath sounds. No wheezing, rhonchi or rales.  Musculoskeletal:        General: Normal range of motion.     Right lower leg: No edema.     Left lower leg: No edema.  Skin:    General: Skin is warm and dry.     Findings: No rash.  Neurological:     General: No focal deficit present.     Mental Status: He is alert and oriented to person, place, and time.     Cranial Nerves: No cranial nerve deficit.  Psychiatric:        Mood and Affect: Mood normal.        Behavior: Behavior normal.        Thought Content: Thought content normal.        Judgment: Judgment normal.      Assessment and Plan   1. Essential hypertension, benign - CBC - lisinopril (ZESTRIL) 5 MG tablet; Take 1 tablet (5 mg total) by mouth 2 (two) times daily.  Dispense: 180 tablet; Refill: 1  2. Type 2 diabetes mellitus without complication, without long-term current use of insulin (HCC) - metFORMIN (GLUCOPHAGE) 850 MG tablet; Take 1 tablet (850 mg total) by mouth 2 (two) times daily with a meal. Take one po bid  Dispense: 60 tablet; Refill: 5 - TSH -  CBC - CMP14+EGFR - Hemoglobin A1c - Lipid panel - Microalbumin, urine - glipiZIDE (GLUCOTROL XL) 5 MG 24 hr tablet; Take 2 tablets Q AM  Dispense: 180 tablet; Refill: 1  3. Thyroid nodule - TSH  4. Screening PSA (prostate specific antigen) - PSA  5. Postoperative hypothyroidism - levothyroxine (SYNTHROID) 50 MCG tablet; Take 1 tablet (50 mcg total) by mouth daily.  Dispense: 90 tablet; Refill: 1    dm2- a1c at 8.6, increased from last visit.  Last time t 7.3. pt to dec carbs and increase in exercising.  Inc metformin from 544m bid to 8544mbid.  Pt stating up to date on eye exam.  Cholesterol stable, elevated Tgs,  Dec carbs and cont with zocor.  Lab Results  Component Value Date   HGBA1C 8.6 (H) 06/05/2020   Hypothyroidism - stable. Cont meds.  htn- suboptimal. Dec salt in diet, and increase in exercising.  Inc in lisinopril from 58m daily to bid.  Sinusitis- gave augmentin, mucinex and flonase.  F/u 6 mo or prn.

## 2020-06-23 ENCOUNTER — Ambulatory Visit
Admission: EM | Admit: 2020-06-23 | Discharge: 2020-06-23 | Disposition: A | Discharge status: Home / Self Care | Payer: 59 | Attending: Urgent Care | Admitting: Urgent Care

## 2020-06-23 ENCOUNTER — Other Ambulatory Visit: Payer: Self-pay

## 2020-06-23 DIAGNOSIS — Z1152 Encounter for screening for COVID-19: Secondary | ICD-10-CM

## 2020-06-27 LAB — NOVEL CORONAVIRUS, NAA: SARS-CoV-2, NAA: NOT DETECTED

## 2020-07-28 ENCOUNTER — Other Ambulatory Visit: Payer: Self-pay

## 2020-07-28 ENCOUNTER — Encounter: Payer: Self-pay | Admitting: Family Medicine

## 2020-07-28 ENCOUNTER — Telehealth (INDEPENDENT_AMBULATORY_CARE_PROVIDER_SITE_OTHER): Payer: 59 | Admitting: Family Medicine

## 2020-07-28 DIAGNOSIS — U071 COVID-19: Secondary | ICD-10-CM | POA: Insufficient documentation

## 2020-07-28 DIAGNOSIS — R059 Cough, unspecified: Secondary | ICD-10-CM | POA: Insufficient documentation

## 2020-07-28 MED ORDER — BENZONATATE 100 MG PO CAPS: 100.0000 mg | ORAL_CAPSULE | Freq: Two times a day (BID) | ORAL | 0 refills | Status: DC | PRN

## 2020-07-28 MED ORDER — ALBUTEROL SULFATE HFA 108 (90 BASE) MCG/ACT IN AERS: 2.0000 | INHALATION_SPRAY | Freq: Four times a day (QID) | RESPIRATORY_TRACT | 0 refills | Status: DC | PRN

## 2020-07-28 NOTE — Patient Instructions (Signed)
Covid-19 warning:  Covid-19 is a virus that causes hypoxia (low oxygen level in blood) in some people. If you develop any changes in your usual breathing pattern: difficulty catching your breath, more short winded with activity or with resting, or anything that concerns you about your breathing, do not hesitate to go to the emergency department immediately for evaluation. Covid infection can also affect the way the brain functions if it lacks oxygen, such as, feeling dizzy, passing out, or feeling confused, if you experience any of these symptoms, please do not delay to seek treatment.  Some people experience gastrointestinal problems with Covid, such as vomiting and diarrhea, dehydration is a serious risk and should be avoided. If you are unable to keep liquids down you may need to go to the emergency department for intravenous fluids to avoid dehydration.   Please alert and involve your family and/or friends to help keep an eye on you while you recover from Covid-19. If you have any questions or concerns about your recovery, please do not hesitate to call the office for guidance.   Covid-19 Quarantine Instructions:   You have tested positive for Covid-19 infection. The current CDC guidelines for quarantine regardless of vaccination status are:   Please quarantine and isolate at home for a minimum of  5 days.   - If you have no symptoms or your symptoms are resolving after 5 days you   can leave the home (resolving means no shortness of breath, no fever, without taking fever reducing medication, no headache, etc). -Continue to wear a mask around others for an additional 5 days.  -If you were severely ill with Covid-19 you should isolate for at least 10 days.    Use over-the-counter medications for symptoms.If you develop respiratory issues/distress (see Covid warning), seek medical care in the Emergency Department.  If you must leave home or if you have to be around others please wear a mask.  Please limit contact with immediate family members in the home, practice social distancing, frequent handwashing and clean hard surfaces touched frequently with household cleaning products. Members of your household will also need to quarantine for 5 days and test on day five if possible.  Covid-19 warning:  Covid-19 is a virus that causes hypoxia (low oxygen level in blood) in some people. If you develop any changes in your usual breathing pattern: difficulty catching your breath, more short winded with activity or with resting, or anything that concerns you about your breathing, do not hesitate to go to the emergency department immediately for evaluation. Covid infection can also affect the way the brain functions if it lacks oxygen, such as, feeling dizzy, passing out, or feeling confused, if you experience any of these symptoms, please do not delay to seek treatment.  Some people experience gastrointestinal problems with Covid, such as vomiting and diarrhea, dehydration is a serious risk and should be avoided. If you are unable to keep liquids down you may need to go to the emergency department for intravenous fluids to avoid dehydration.   Please alert and involve your family and/or friends to help keep an eye on you while you recover from Covid-19. If you have any questions or concerns about your recovery, please do not hesitate to call the office for guidance.    Recommend supportive therapy while you are recovering:   1) Get lots of rest.  2) Take over the counter pain medication if needed, such as acetaminophen or ibuprofen. Read and follow instructions on the label  and make sure not to combine other medications that may have same ingredients in it. It is important to not take too much of these ingredients.  3) Drink plenty of caffeine-free fluids. (If you have heart or kidney problems, follow the instructions of your specialist regarding amounts).  4) If you are hungry, eat a bland diet,  such as the BRAT diet (bananas, rice, applesauce, toast).  5) Let us know if you are not feeling better in a week.

## 2020-07-28 NOTE — Progress Notes (Signed)
Patient ID: Cristian Nguyen, male    DOB: [birth year], 65 y.o.   MRN: [id number]   Chief Complaint  Patient presents with  . Covid Positive   Subjective:  CC: Covid positive.  This is a new problem.  Presents today via attempted video visit, lost sound after a couple of minutes, visit converted to phone visit.  Reports Covid positive test resulted yesterday, associated symptoms include fever, chills, congestion, dry cough, soreness in chest and abdomen from coughing.  Symptoms started 3 days ago.  Has tried Mucinex and Delsym.  T-max 100.4, last fever was Saturday night.  Denies stuffiness in his nose, drinking adequate water to stay hydrated.  Wife also testing positive for Covid infection.  She is present during the phone visit today.   covid test came back positive yesterday. Symptoms started 3 days ago. Had low grade fever, dry cough, sore in chest and abdomen from coughing. Tried mucinex and delsym.   Virtual Visit via Video Note  I connected with Cristian Nguyen on 07/28/20 at  2:00 PM EST by a video enabled telemedicine application and verified that I am speaking with the correct person using two identifiers.  Location: Patient: home Provider: office   I discussed the limitations of evaluation and management by telemedicine and the availability of in person appointments. The patient expressed understanding and agreed to proceed.  History of Present Illness:    Observations/Objective:   Assessment and Plan:   Follow Up Instructions:    I discussed the assessment and treatment plan with the patient. The patient was provided an opportunity to ask questions and all were answered. The patient agreed with the plan and demonstrated an understanding of the instructions.   The patient was advised to call back or seek an in-person evaluation if the symptoms worsen or if the condition fails to improve as anticipated.  I provided 21 minutes of non-face-to-face time during this  encounter.      Medical History [name] has a past medical history of Atypical nevus (07/30/2010), BCC (basal cell carcinoma of skin) (07/30/2010), BCC (basal cell carcinoma of skin) (03/26/2014), COPD (chronic obstructive pulmonary disease) ([place]), GERD (gastroesophageal reflux disease), History of basal cell carcinoma (BCC) excision, Hyperlipidemia, Hypertension, Hyperthyroidism, Nocturia, Nodular basal cell carcinoma (BCC) (07/31/2018), Nodular basal cell carcinoma (BCC) (07/31/2018), Nodular basal cell carcinoma (BCC) (12/14/2018), OA (osteoarthritis), Thyroid adenoma, toxic, Type 2 diabetes mellitus ([place]), and Wears contact lenses.   Outpatient Encounter Medications as of 07/28/2020  Medication Sig  . ACCU-CHEK GUIDE test strip USE 1 STRIP TO CHECK GLUCOSE ONCE DAILY  . albuterol (VENTOLIN HFA) 108 (90 Base) MCG/ACT inhaler Inhale 2 puffs into the lungs every 6 (six) hours as needed for wheezing or shortness of breath.  . benzonatate (TESSALON) 100 MG capsule Take 1 capsule (100 mg total) by mouth 2 (two) times daily as needed for cough.  . blood glucose meter kit and supplies Dispense based on patient and insurance preference. Use to check blood sugar once daily. For ICD- 10  E11.9  . dextromethorphan-guaiFENesin (MUCINEX DM) 30-600 MG 12hr tablet Take 1 tablet by mouth 2 (two) times daily.  Marland Kitchen esomeprazole (NEXIUM) 40 MG capsule TAKE ONE CAPSULE BY MOUTH ONCE DAILY BEFORE BREAKFAST  . fluticasone (FLONASE) 50 MCG/ACT nasal spray Place 1-2 sprays into both nostrils daily.  Marland Kitchen glipiZIDE (GLUCOTROL XL) 5 MG 24 hr tablet Take 2 tablets Q AM  . levothyroxine (SYNTHROID) 50 MCG tablet Take 1 tablet (50 mcg total) by mouth daily.  Marland Kitchen  lisinopril (ZESTRIL) 5 MG tablet Take 1 tablet (5 mg total) by mouth 2 (two) times daily.  . metFORMIN (GLUCOPHAGE) 850 MG tablet Take 1 tablet (850 mg total) by mouth 2 (two) times daily with a meal. Take one po bid  . simvastatin (ZOCOR) 20 MG tablet Take 1 tablet (20  mg total) by mouth daily with breakfast.   No facility-administered encounter medications on file as of 07/28/2020.     Review of Systems  Constitutional: Positive for chills, fatigue and fever.  HENT: Positive for congestion and postnasal drip. Negative for ear pain and sore throat.   Respiratory: Positive for cough and chest tightness. Negative for shortness of breath.   Cardiovascular: Negative for chest pain.  Gastrointestinal: Positive for abdominal pain and diarrhea. Negative for nausea and vomiting.  Musculoskeletal: Positive for myalgias.  Neurological: Positive for headaches.       Resolved now.      Vitals There were no vitals taken for this visit. Unable, does not have pulse ox meter.   Objective:   Physical Exam  No physical exam, able to converse throughout phone visit without obvious shortness of breath. Assessment and Plan   1. COVID-19 virus infection - albuterol (VENTOLIN HFA) 108 (90 Base) MCG/ACT inhaler; Inhale 2 puffs into the lungs every 6 (six) hours as needed for wheezing or shortness of breath.  Dispense: 8 g; Refill: 0 - benzonatate (TESSALON) 100 MG capsule; Take 1 capsule (100 mg total) by mouth 2 (two) times daily as needed for cough.  Dispense: 30 capsule; Refill: 0  2. Cough - albuterol (VENTOLIN HFA) 108 (90 Base) MCG/ACT inhaler; Inhale 2 puffs into the lungs every 6 (six) hours as needed for wheezing or shortness of breath.  Dispense: 8 g; Refill: 0 - benzonatate (TESSALON) 100 MG capsule; Take 1 capsule (100 mg total) by mouth 2 (two) times daily as needed for cough.  Dispense: 30 capsule; Refill: 0   Recommend supportive therapy, adequate hydration, saline nasal flushes, humidification.  Will treat cough with albuterol inhaler and Tessalon Perles.  For nasal congestion, encouraged to use Flonase which she already has if needed.   Agrees with plan of care discussed today. Understands warning signs to seek further care: chest pain, shortness of  breath, any significant change in health.  Understands to follow-up if symptoms do not improve, or worsen.  Covid warnings as stated below given.  Warnings also sent to wife, Shirlean Mylar via [employer].   Covid-19 warning:  Covid-19 is a virus that causes hypoxia (low oxygen level in blood) in some people. If you develop any changes in your usual breathing pattern: difficulty catching your breath, more short winded with activity or with resting, or anything that concerns you about your breathing, do not hesitate to go to the emergency department immediately for evaluation. Covid infection can also affect the way the brain functions if it lacks oxygen, such as, feeling dizzy, passing out, or feeling confused, if you experience any of these symptoms, please do not delay to seek treatment.  Some people experience gastrointestinal problems with Covid, such as vomiting and diarrhea, dehydration is a serious risk and should be avoided. If you are unable to keep liquids down you may need to go to the emergency department for intravenous fluids to avoid dehydration.   Please alert and involve your family and/or friends to help keep an eye on you while you recover from Covid-19. If you have any questions or concerns about your recovery, please do not  hesitate to call the office for guidance.   Covid-19 Quarantine Instructions:   You have tested positive for Covid-19 infection. The current CDC guidelines for quarantine regardless of vaccination status are:   Please quarantine and isolate at home for a minimum of  5 days.   - If you have no symptoms or your symptoms are resolving after 5 days you   can leave the home (resolving means no shortness of breath, no fever, without taking fever reducing medication, no headache, etc). -Continue to wear a mask around others for an additional 5 days.  -If you were severely ill with Covid-19 you should isolate for at least 10 days.    Use over-the-counter medications for  symptoms.If you develop respiratory issues/distress (see Covid warning), seek medical care in the Emergency Department.  If you must leave home or if you have to be around others please wear a mask. Please limit contact with immediate family members in the home, practice social distancing, frequent handwashing and clean hard surfaces touched frequently with household cleaning products. Members of your household will also need to quarantine for 5 days and test on day five if possible.  Covid-19 warning:  Covid-19 is a virus that causes hypoxia (low oxygen level in blood) in some people. If you develop any changes in your usual breathing pattern: difficulty catching your breath, more short winded with activity or with resting, or anything that concerns you about your breathing, do not hesitate to go to the emergency department immediately for evaluation. Covid infection can also affect the way the brain functions if it lacks oxygen, such as, feeling dizzy, passing out, or feeling confused, if you experience any of these symptoms, please do not delay to seek treatment.  Some people experience gastrointestinal problems with Covid, such as vomiting and diarrhea, dehydration is a serious risk and should be avoided. If you are unable to keep liquids down you may need to go to the emergency department for intravenous fluids to avoid dehydration.   Please alert and involve your family and/or friends to help keep an eye on you while you recover from Covid-19. If you have any questions or concerns about your recovery, please do not hesitate to call the office for guidance.    Recommend supportive therapy while you are recovering:   1) Get lots of rest.  2) Take over the counter pain medication if needed, such as acetaminophen or ibuprofen. Read and follow instructions on the label and make sure not to combine other medications that may have same ingredients in it. It is important to not take too much of these  ingredients.  3) Drink plenty of caffeine-free fluids. (If you have heart or kidney problems, follow the instructions of your specialist regarding amounts).  4) If you are hungry, eat a bland diet, such as the BRAT diet (bananas, rice, applesauce, toast).  5) Let us know if you are not feeling better in a week.   Pecolia Ades, NP 07/28/2020

## 2020-12-03 LAB — CMP14+EGFR
ALT: 26 [IU]/L (ref 0–44)
AST: 25 [IU]/L (ref 0–40)
Albumin/Globulin Ratio: 2 (ref 1.2–2.2)
Albumin: 4.3 g/dL (ref 3.8–4.8)
Alkaline Phosphatase: 63 [IU]/L (ref 44–121)
BUN/Creatinine Ratio: 14 (ref 10–24)
BUN: 13 mg/dL (ref 8–27)
Bilirubin Total: 0.4 mg/dL (ref 0.0–1.2)
CO2: 24 mmol/L (ref 20–29)
Calcium: 9.3 mg/dL (ref 8.6–10.2)
Chloride: 100 mmol/L (ref 96–106)
Creatinine, Ser: 0.92 mg/dL (ref 0.76–1.27)
Globulin, Total: 2.1 g/dL (ref 1.5–4.5)
Glucose: 146 mg/dL — ABNORMAL HIGH (ref 65–99)
Potassium: 4.6 mmol/L (ref 3.5–5.2)
Sodium: 140 mmol/L (ref 134–144)
Total Protein: 6.4 g/dL (ref 6.0–8.5)
eGFR: 93 mL/min/{1.73_m2}

## 2020-12-03 LAB — LIPID PANEL
Chol/HDL Ratio: 5.4 ratio — ABNORMAL HIGH (ref 0.0–5.0)
Cholesterol, Total: 193 mg/dL (ref 100–199)
HDL: 36 mg/dL — ABNORMAL LOW
LDL Chol Calc (NIH): 118 mg/dL — ABNORMAL HIGH (ref 0–99)
Triglycerides: 221 mg/dL — ABNORMAL HIGH (ref 0–149)
VLDL Cholesterol Cal: 39 mg/dL (ref 5–40)

## 2020-12-03 LAB — MICROALBUMIN, URINE: Microalbumin, Urine: 15.5 ug/mL

## 2020-12-03 LAB — CBC
Hematocrit: 45.8 % (ref 37.5–51.0)
Hemoglobin: 15.3 g/dL (ref 13.0–17.7)
MCH: 29.7 pg (ref 26.6–33.0)
MCHC: 33.4 g/dL (ref 31.5–35.7)
MCV: 89 fL (ref 79–97)
Platelets: 215 10*3/uL (ref 150–450)
RBC: 5.16 x10E6/uL (ref 4.14–5.80)
RDW: 12.4 % (ref 11.6–15.4)
WBC: 6.7 10*3/uL (ref 3.4–10.8)

## 2020-12-03 LAB — PSA: Prostate Specific Ag, Serum: 1 ng/mL (ref 0.0–4.0)

## 2020-12-03 LAB — TSH: TSH: 3.94 u[IU]/mL (ref 0.450–4.500)

## 2020-12-03 LAB — HEMOGLOBIN A1C
Est. average glucose Bld gHb Est-mCnc: 160 mg/dL
Hgb A1c MFr Bld: 7.2 % — ABNORMAL HIGH (ref 4.8–5.6)

## 2020-12-15 ENCOUNTER — Ambulatory Visit: Payer: 59 | Admitting: Family Medicine

## 2020-12-24 ENCOUNTER — Ambulatory Visit: Payer: 59 | Admitting: Family Medicine

## 2020-12-24 ENCOUNTER — Other Ambulatory Visit: Payer: Self-pay

## 2020-12-24 ENCOUNTER — Encounter: Payer: Self-pay | Admitting: Family Medicine

## 2020-12-24 VITALS — BP 149/93 | HR 63 | Temp 97.3°F | Wt 272.4 lb

## 2020-12-24 DIAGNOSIS — K219 Gastro-esophageal reflux disease without esophagitis: Secondary | ICD-10-CM | POA: Diagnosis not present

## 2020-12-24 DIAGNOSIS — E890 Postprocedural hypothyroidism: Secondary | ICD-10-CM | POA: Diagnosis not present

## 2020-12-24 DIAGNOSIS — E119 Type 2 diabetes mellitus without complications: Secondary | ICD-10-CM | POA: Diagnosis not present

## 2020-12-24 DIAGNOSIS — I10 Essential (primary) hypertension: Secondary | ICD-10-CM | POA: Diagnosis not present

## 2020-12-24 DIAGNOSIS — E785 Hyperlipidemia, unspecified: Secondary | ICD-10-CM

## 2020-12-24 MED ORDER — GLIPIZIDE ER 5 MG PO TB24: ORAL_TABLET | ORAL | 1 refills | Status: DC

## 2020-12-24 MED ORDER — LEVOTHYROXINE SODIUM 50 MCG PO TABS: 50.0000 ug | ORAL_TABLET | Freq: Every day | ORAL | 1 refills | Status: DC

## 2020-12-24 MED ORDER — LISINOPRIL 5 MG PO TABS: 5.0000 mg | ORAL_TABLET | Freq: Two times a day (BID) | ORAL | 1 refills | Status: DC

## 2020-12-24 MED ORDER — METFORMIN HCL 850 MG PO TABS: 850.0000 mg | ORAL_TABLET | Freq: Two times a day (BID) | ORAL | 5 refills | Status: DC

## 2020-12-24 MED ORDER — SIMVASTATIN 20 MG PO TABS: 20.0000 mg | ORAL_TABLET | Freq: Every day | ORAL | 1 refills | Status: DC

## 2020-12-24 MED ORDER — ESOMEPRAZOLE MAGNESIUM 40 MG PO CPDR: DELAYED_RELEASE_CAPSULE | ORAL | 1 refills | Status: DC

## 2020-12-24 NOTE — Progress Notes (Signed)
Patient ID: Cristian Nguyen, male    DOB: [birth year], 65 y.o.   MRN: [id number]   Chief Complaint  Patient presents with   Hypertension   Diabetes   Subjective:    HPI  Pt here for follow up on HTN and DM. Pt checking sugars every other day. This morning was 125. No issues. Pt taking meds as directed.   Pt didn't take lisinopril yet today.  Seen for htn f/u.    Seeing numbers- ata home 135/80s. Working on diet and eating better.  At goal for diabetes 7.2 now and was at 8.6 --6 months ago.   Not having foot issues. Eye exam- has contacts and will get eye exam soon. Seeing them in the next month.  Dm2- Compliant with medications. Checking blood glucose.   Not seeing any high or low numbers.  Denies polyuria or polydipsia.  Eye exam: overdue Foot exam: no new concerns.  HTN Pt compliant with BP meds.  No SEs Denies chest pain, sob, LE swelling, or blurry vision.  Hypothyroidism- Not having any side effects from thyroid meds.  Compliant with meds. No diarrhea, constipation, depression/anxiety, palpitations, excessive hair loss or weight gain/loss. No heat/cold intolerance.  Medical History Cristian Nguyen has a past medical history of Atypical nevus (07/30/2010), BCC (basal cell carcinoma of skin) (07/30/2010), BCC (basal cell carcinoma of skin) (03/26/2014), COPD (chronic obstructive pulmonary disease) ([place]), GERD (gastroesophageal reflux disease), History of basal cell carcinoma (BCC) excision, Hyperlipidemia, Hypertension, Hyperthyroidism, Nocturia, Nodular basal cell carcinoma (BCC) (07/31/2018), Nodular basal cell carcinoma (BCC) (07/31/2018), Nodular basal cell carcinoma (BCC) (12/14/2018), OA (osteoarthritis), Thyroid adenoma, toxic, Type 2 diabetes mellitus ([place]), and Wears contact lenses.   Outpatient Encounter Medications as of 12/24/2020  Medication Sig   ACCU-CHEK GUIDE test strip USE 1 STRIP TO CHECK GLUCOSE ONCE DAILY   albuterol (VENTOLIN HFA) 108 (90 Base)  MCG/ACT inhaler Inhale 2 puffs into the lungs every 6 (six) hours as needed for wheezing or shortness of breath.   blood glucose meter kit and supplies Dispense based on patient and insurance preference. Use to check blood sugar once daily. For ICD- 10  E11.9   dextromethorphan-guaiFENesin (MUCINEX DM) 30-600 MG 12hr tablet Take 1 tablet by mouth 2 (two) times daily.   fluticasone (FLONASE) 50 MCG/ACT nasal spray Place 1-2 sprays into both nostrils daily.   [DISCONTINUED] esomeprazole (NEXIUM) 40 MG capsule TAKE ONE CAPSULE BY MOUTH ONCE DAILY BEFORE BREAKFAST   [DISCONTINUED] glipiZIDE (GLUCOTROL XL) 5 MG 24 hr tablet Take 2 tablets Q AM   [DISCONTINUED] levothyroxine (SYNTHROID) 50 MCG tablet Take 1 tablet (50 mcg total) by mouth daily.   [DISCONTINUED] lisinopril (ZESTRIL) 5 MG tablet Take 1 tablet (5 mg total) by mouth 2 (two) times daily.   [DISCONTINUED] metFORMIN (GLUCOPHAGE) 850 MG tablet Take 1 tablet (850 mg total) by mouth 2 (two) times daily with a meal. Take one po bid   [DISCONTINUED] simvastatin (ZOCOR) 20 MG tablet Take 1 tablet (20 mg total) by mouth daily with breakfast.   esomeprazole (NEXIUM) 40 MG capsule TAKE ONE CAPSULE BY MOUTH ONCE DAILY BEFORE BREAKFAST   glipiZIDE (GLUCOTROL XL) 5 MG 24 hr tablet Take 2 tablets Q AM   levothyroxine (SYNTHROID) 50 MCG tablet Take 1 tablet (50 mcg total) by mouth daily.   lisinopril (ZESTRIL) 5 MG tablet Take 1 tablet (5 mg total) by mouth 2 (two) times daily.   metFORMIN (GLUCOPHAGE) 850 MG tablet Take 1 tablet (850 mg total) by mouth  2 (two) times daily with a meal. Take one po bid   simvastatin (ZOCOR) 20 MG tablet Take 1 tablet (20 mg total) by mouth daily with breakfast.   [DISCONTINUED] benzonatate (TESSALON) 100 MG capsule Take 1 capsule (100 mg total) by mouth 2 (two) times daily as needed for cough.   No facility-administered encounter medications on file as of 12/24/2020.     Review of Systems  Constitutional:  Negative for  chills and fever.  HENT:  Negative for congestion, rhinorrhea and sore throat.   Respiratory:  Negative for cough, shortness of breath and wheezing.   Cardiovascular:  Negative for chest pain and leg swelling.  Gastrointestinal:  Negative for abdominal pain, diarrhea, nausea and vomiting.  Genitourinary:  Negative for dysuria and frequency.  Skin:  Negative for rash.  Neurological:  Negative for dizziness, weakness and headaches.    Vitals BP (!) 149/93   Pulse 63   Temp (!) 97.3 F (36.3 C)   Wt 272 lb 6.4 oz (123.6 kg)   SpO2 94%   BMI 34.97 kg/m   Objective:   Physical Exam Vitals and nursing note reviewed.  Constitutional:      General: He is not in acute distress.    Appearance: Normal appearance. He is not ill-appearing.  HENT:     Head: Normocephalic.     Nose: Nose normal. No congestion.     Mouth/Throat:     Mouth: Mucous membranes are moist.     Pharynx: No oropharyngeal exudate.  Eyes:     Extraocular Movements: Extraocular movements intact.     Conjunctiva/sclera: Conjunctivae normal.     Pupils: Pupils are equal, round, and reactive to light.  Cardiovascular:     Rate and Rhythm: Normal rate and regular rhythm.     Pulses: Normal pulses.     Heart sounds: Normal heart sounds. No murmur heard. Pulmonary:     Effort: Pulmonary effort is normal.     Breath sounds: Normal breath sounds. No wheezing, rhonchi or rales.  Musculoskeletal:        General: Normal range of motion.     Right lower leg: No edema.     Left lower leg: No edema.  Skin:    General: Skin is warm and dry.     Findings: No rash.  Neurological:     General: No focal deficit present.     Mental Status: He is alert and oriented to person, place, and time.     Cranial Nerves: No cranial nerve deficit.  Psychiatric:        Mood and Affect: Mood normal.        Behavior: Behavior normal.        Thought Content: Thought content normal.        Judgment: Judgment normal.     Assessment  and Plan   1. Type 2 diabetes mellitus without complication, without long-term current use of insulin (HCC) - glipiZIDE (GLUCOTROL XL) 5 MG 24 hr tablet; Take 2 tablets Q AM  Dispense: 180 tablet; Refill: 1 - metFORMIN (GLUCOPHAGE) 850 MG tablet; Take 1 tablet (850 mg total) by mouth 2 (two) times daily with a meal. Take one po bid  Dispense: 60 tablet; Refill: 5  2. Postoperative hypothyroidism - levothyroxine (SYNTHROID) 50 MCG tablet; Take 1 tablet (50 mcg total) by mouth daily.  Dispense: 90 tablet; Refill: 1  3. Gastroesophageal reflux disease without esophagitis - esomeprazole (NEXIUM) 40 MG capsule; TAKE ONE CAPSULE BY MOUTH ONCE DAILY  BEFORE BREAKFAST  Dispense: 90 capsule; Refill: 1  4. Essential hypertension, benign - lisinopril (ZESTRIL) 5 MG tablet; Take 1 tablet (5 mg total) by mouth 2 (two) times daily.  Dispense: 180 tablet; Refill: 1  5. Hyperlipidemia, unspecified hyperlipidemia type - simvastatin (ZOCOR) 20 MG tablet; Take 1 tablet (20 mg total) by mouth daily with breakfast.  Dispense: 90 tablet; Refill: 1   H/o covid 19- Got over the covid and improved and no residual issues.  Dm2- stable. Improved. Cont meds.  Hypothyroidism-stable. Cont meds.  Htn- suboptimal. Cont meds, dec salt.  Recheck next visit.  Hld- stable. Cont meds.  Return in about 6 months (around 06/25/2021) for f/u dm, htn, hld.

## 2021-06-08 ENCOUNTER — Other Ambulatory Visit: Payer: Self-pay

## 2021-06-08 ENCOUNTER — Encounter: Payer: Self-pay | Admitting: Dermatology

## 2021-06-08 ENCOUNTER — Ambulatory Visit: Payer: 59 | Admitting: Dermatology

## 2021-06-08 DIAGNOSIS — D1801 Hemangioma of skin and subcutaneous tissue: Secondary | ICD-10-CM

## 2021-06-08 DIAGNOSIS — C44319 Basal cell carcinoma of skin of other parts of face: Secondary | ICD-10-CM

## 2021-06-08 DIAGNOSIS — Z1283 Encounter for screening for malignant neoplasm of skin: Secondary | ICD-10-CM

## 2021-06-08 DIAGNOSIS — L918 Other hypertrophic disorders of the skin: Secondary | ICD-10-CM | POA: Diagnosis not present

## 2021-06-08 DIAGNOSIS — D485 Neoplasm of uncertain behavior of skin: Secondary | ICD-10-CM

## 2021-06-08 NOTE — Patient Instructions (Signed)

## 2021-06-15 ENCOUNTER — Ambulatory Visit (HOSPITAL_COMMUNITY)
Admission: RE | Admit: 2021-06-15 | Discharge: 2021-06-15 | Disposition: A | Discharge status: Home / Self Care | Payer: 59 | Source: Ambulatory Visit | Attending: Family Medicine | Admitting: Family Medicine

## 2021-06-15 ENCOUNTER — Other Ambulatory Visit (HOSPITAL_COMMUNITY)
Admission: RE | Admit: 2021-06-15 | Discharge: 2021-06-15 | Disposition: A | Discharge status: Home / Self Care | Payer: 59 | Source: Ambulatory Visit | Attending: Family Medicine | Admitting: Family Medicine

## 2021-06-15 ENCOUNTER — Ambulatory Visit: Payer: 59 | Admitting: Family Medicine

## 2021-06-15 ENCOUNTER — Other Ambulatory Visit: Payer: Self-pay

## 2021-06-15 ENCOUNTER — Telehealth: Payer: Self-pay | Admitting: *Deleted

## 2021-06-15 VITALS — BP 130/78 | HR 99 | Temp 97.7°F | Ht 74.0 in | Wt 269.4 lb

## 2021-06-15 DIAGNOSIS — E119 Type 2 diabetes mellitus without complications: Secondary | ICD-10-CM

## 2021-06-15 DIAGNOSIS — E890 Postprocedural hypothyroidism: Secondary | ICD-10-CM

## 2021-06-15 DIAGNOSIS — E782 Mixed hyperlipidemia: Secondary | ICD-10-CM

## 2021-06-15 DIAGNOSIS — I10 Essential (primary) hypertension: Secondary | ICD-10-CM

## 2021-06-15 DIAGNOSIS — K219 Gastro-esophageal reflux disease without esophagitis: Secondary | ICD-10-CM

## 2021-06-15 DIAGNOSIS — Z130 Encounter for screening for diseases of the blood and blood-forming organs and certain disorders involving the immune mechanism: Secondary | ICD-10-CM

## 2021-06-15 DIAGNOSIS — R053 Chronic cough: Secondary | ICD-10-CM | POA: Insufficient documentation

## 2021-06-15 MED ORDER — GLIPIZIDE ER 5 MG PO TB24: ORAL_TABLET | ORAL | 1 refills | Status: DC

## 2021-06-15 MED ORDER — ESOMEPRAZOLE MAGNESIUM 40 MG PO CPDR: DELAYED_RELEASE_CAPSULE | ORAL | 1 refills | Status: DC

## 2021-06-15 MED ORDER — LEVOTHYROXINE SODIUM 50 MCG PO TABS: 50.0000 ug | ORAL_TABLET | Freq: Every day | ORAL | 1 refills | Status: DC

## 2021-06-15 MED ORDER — ALBUTEROL SULFATE HFA 108 (90 BASE) MCG/ACT IN AERS: 2.0000 | INHALATION_SPRAY | Freq: Four times a day (QID) | RESPIRATORY_TRACT | 0 refills | Status: DC | PRN

## 2021-06-15 MED ORDER — LISINOPRIL 5 MG PO TABS: 5.0000 mg | ORAL_TABLET | Freq: Two times a day (BID) | ORAL | 1 refills | Status: DC

## 2021-06-15 NOTE — Assessment & Plan Note (Signed)
At goal. Continue lisinopril.  

## 2021-06-15 NOTE — Telephone Encounter (Signed)
Pathology to patient-surgery appointment scheduled.  

## 2021-06-15 NOTE — Assessment & Plan Note (Signed)
Former smoker.  Chest x-ray obtained today was independently reviewed by me.  No acute abnormalities.  Advised over-the-counter antihistamine.  Albuterol refilled.  Will place on Symbicort.

## 2021-06-15 NOTE — Progress Notes (Signed)
Subjective:  Patient ID: Cristian Nguyen, male    DOB: Apr 14, 1956  Age: 65 y.o. MRN: [id number]  CC: Chief Complaint  Patient presents with   Diabetes    Pt has had lingering cough for about one month. Pt works at [employer] twice a week and not sure if that is a contributing factor. Pt is able to cough up phelgm at times.     HPI:  65 year old male with hypertension, type 2 diabetes, hypothyroidism, hyperlipidemia presents for follow-up.  Patient complains of cough.  Cough Patient reports that he has had a cough for the past 2 months.  Slightly productive at times.  Patient states that he works in a [employer] and thinks that the dust may be contributing. He has not used his inhaler.  He states that it is expired. He also reports some congestion.  No relieving factors.  No fever.  No other respiratory symptoms.  Hypertension Patient blood pressure is at goal on lisinopril.  Tolerating well.  Type 2 diabetes Needs A1c today.  Last A1c was 7.2.  Patient is currently on metformin and glipizide. Has an upcoming eye exam in January.  No documented hypoglycemia.  Patient Active Problem List   Diagnosis Date Noted   Post-operative hypothyroidism 06/15/2021   Chronic cough 06/15/2021   S/P subtotal thyroidectomy 09/01/2018   History of colon polyps    Type 2 diabetes mellitus without complication ([place]) 19/14/7829   Other and unspecified hyperlipidemia 05/08/2013   Esophageal reflux 05/08/2013   Essential hypertension, benign 05/08/2013    Social Hx   Social History   Socioeconomic History   Marital status: Married    Spouse name: Not on file   Number of children: Not on file   Years of education: Not on file   Highest education level: Not on file  Occupational History   Not on file  Tobacco Use   Smoking status: Former    Years: 35.00    Types: Cigarettes    Quit date: 12/09/2006    Years since quitting: 14.5   Smokeless tobacco: Never  Vaping Use   Vaping Use:  Never used  Substance and Sexual Activity   Alcohol use: Yes    Comment: occasional   Drug use: Never   Sexual activity: Not Currently  Other Topics Concern   Not on file  Social History Narrative   Not on file   Social Determinants of Health   Financial Resource Strain: Not on file  Food Insecurity: Not on file  Transportation Needs: Not on file  Physical Activity: Not on file  Stress: Not on file  Social Connections: Not on file    Review of Systems  Constitutional: Negative.   Respiratory:  Positive for cough.     Objective:  BP 130/78   Pulse 99   Temp 97.7 F (36.5 C)   Ht 6' 2" (1.88 m)   Wt 269 lb 6.4 oz (122.2 kg)   SpO2 98%   BMI 34.59 kg/m   BP/Weight 06/15/2021 12/24/2020 56/21/3086  Systolic BP 578 469 629  Diastolic BP 78 93 88  Wt. (Lbs) 269.4 272.4 271.2  BMI 34.59 34.97 34.82    Physical Exam Vitals and nursing note reviewed.  Constitutional:      General: He is not in acute distress.    Appearance: Normal appearance. He is not ill-appearing.  HENT:     Head: Normocephalic and atraumatic.  Eyes:     General:  Right eye: No discharge.        Left eye: No discharge.     Conjunctiva/sclera: Conjunctivae normal.  Cardiovascular:     Rate and Rhythm: Normal rate and regular rhythm.  Pulmonary:     Effort: Pulmonary effort is normal.     Breath sounds: Normal breath sounds. No wheezing, rhonchi or rales.  Neurological:     Mental Status: He is alert.  Psychiatric:        Mood and Affect: Mood normal.        Behavior: Behavior normal.    Lab Results  Component Value Date   WBC 6.7 12/02/2020   HGB 15.3 12/02/2020   HCT 45.8 12/02/2020   PLT 215 12/02/2020   GLUCOSE 146 (H) 12/02/2020   CHOL 193 12/02/2020   TRIG 221 (H) 12/02/2020   HDL 36 (L) 12/02/2020   LDLCALC 118 (H) 12/02/2020   ALT 26 12/02/2020   AST 25 12/02/2020   NA 140 12/02/2020   K 4.6 12/02/2020   CL 100 12/02/2020   CREATININE 0.92 12/02/2020   BUN 13  12/02/2020   CO2 24 12/02/2020   TSH 3.940 12/02/2020   PSA 1.14 05/02/2013   HGBA1C 7.2 (H) 12/02/2020     Assessment & Plan:   Problem List Items Addressed This Visit       Cardiovascular and Mediastinum   Essential hypertension, benign    At goal.  Continue lisinopril.      Relevant Medications   lisinopril (ZESTRIL) 5 MG tablet     Digestive   Esophageal reflux   Relevant Medications   esomeprazole (NEXIUM) 40 MG capsule     Endocrine   Type 2 diabetes mellitus without complication (HCC)    Has been fairly well controlled.  Needs A1c today.  Ordered.  Continue metformin and glipizide.  We will make dose adjustments based on A1c results.      Relevant Medications   glipiZIDE (GLUCOTROL XL) 5 MG 24 hr tablet   lisinopril (ZESTRIL) 5 MG tablet   Other Relevant Orders   CMP14+EGFR   Hemoglobin A1c   Microalbumin / creatinine urine ratio   Post-operative hypothyroidism   Relevant Medications   levothyroxine (SYNTHROID) 50 MCG tablet   Other Relevant Orders   TSH     Other   Chronic cough - Primary    Former smoker.  Chest x-ray obtained today was independently reviewed by me.  No acute abnormalities.  Advised over-the-counter antihistamine.  Albuterol refilled.  Will place on Symbicort.      Relevant Medications   albuterol (VENTOLIN HFA) 108 (90 Base) MCG/ACT inhaler   Other Relevant Orders   DG Chest 2 View (Completed)   Other Visit Diagnoses     Hyperlipidemia, mixed       Relevant Medications   lisinopril (ZESTRIL) 5 MG tablet   Other Relevant Orders   Lipid panel   Screening for deficiency anemia       Relevant Orders   CBC   Postoperative hypothyroidism       Relevant Medications   levothyroxine (SYNTHROID) 50 MCG tablet       Meds ordered this encounter  Medications   albuterol (VENTOLIN HFA) 108 (90 Base) MCG/ACT inhaler    Sig: Inhale 2 puffs into the lungs every 6 (six) hours as needed for wheezing or shortness of breath.     Dispense:  8 g    Refill:  0   esomeprazole (NEXIUM) 40 MG capsule      Sig: TAKE ONE CAPSULE BY MOUTH ONCE DAILY BEFORE BREAKFAST    Dispense:  90 capsule    Refill:  1   glipiZIDE (GLUCOTROL XL) 5 MG 24 hr tablet    Sig: Take 2 tablets Q AM    Dispense:  180 tablet    Refill:  1    Change in med   levothyroxine (SYNTHROID) 50 MCG tablet    Sig: Take 1 tablet (50 mcg total) by mouth daily.    Dispense:  90 tablet    Refill:  1   lisinopril (ZESTRIL) 5 MG tablet    Sig: Take 1 tablet (5 mg total) by mouth 2 (two) times daily.    Dispense:  180 tablet    Refill:  1    Follow-up:  Return in about 3 months (around 09/13/2021) for Follow up Chronic medical issues.  [place]

## 2021-06-15 NOTE — Telephone Encounter (Signed)
Patient is returning phone call about results.

## 2021-06-15 NOTE — Telephone Encounter (Signed)
-----   Message from Lavonna Monarch, MD sent at 06/12/2021  7:41 AM EST ----- Please schedule as a last surgery of the morning or afternoon with me.  I will discuss with the patient at that time whether I would prefer Mohs surgery for 1 or more of the spots.

## 2021-06-15 NOTE — Telephone Encounter (Signed)
Left message for patient to return our phone call.

## 2021-06-15 NOTE — Assessment & Plan Note (Signed)
Has been fairly well controlled.  Needs A1c today.  Ordered.  Continue metformin and glipizide.  We will make dose adjustments based on A1c results.

## 2021-06-15 NOTE — Patient Instructions (Addendum)
Labs and xray today.  I have refilled most of your medications. I am waiting on your lab results prior to refill of the Metformin and Simvastatin (I may make changes).  Follow up in 3 months.  Take care  Dr. Lacinda Axon

## 2021-06-16 ENCOUNTER — Other Ambulatory Visit: Payer: Self-pay | Admitting: Family Medicine

## 2021-06-16 DIAGNOSIS — E119 Type 2 diabetes mellitus without complications: Secondary | ICD-10-CM

## 2021-06-16 LAB — CMP14+EGFR
ALT: 38 IU/L (ref 0–44)
AST: 26 IU/L (ref 0–40)
Albumin/Globulin Ratio: 2 (ref 1.2–2.2)
Albumin: 4.5 g/dL (ref 3.8–4.8)
Alkaline Phosphatase: 67 IU/L (ref 44–121)
BUN/Creatinine Ratio: 11 (ref 10–24)
BUN: 11 mg/dL (ref 8–27)
Bilirubin Total: 0.4 mg/dL (ref 0.0–1.2)
CO2: 25 mmol/L (ref 20–29)
Calcium: 9.2 mg/dL (ref 8.6–10.2)
Chloride: 99 mmol/L (ref 96–106)
Creatinine, Ser: 0.99 mg/dL (ref 0.76–1.27)
Globulin, Total: 2.3 g/dL (ref 1.5–4.5)
Glucose: 146 mg/dL — ABNORMAL HIGH (ref 70–99)
Potassium: 4.3 mmol/L (ref 3.5–5.2)
Sodium: 140 mmol/L (ref 134–144)
Total Protein: 6.8 g/dL (ref 6.0–8.5)
eGFR: 85 mL/min/{1.73_m2} (ref >59)

## 2021-06-16 LAB — CBC
Hematocrit: 44.2 % (ref 37.5–51.0)
Hemoglobin: 15.4 g/dL (ref 13.0–17.7)
MCH: 30.4 pg (ref 26.6–33.0)
MCHC: 34.8 g/dL (ref 31.5–35.7)
MCV: 87 fL (ref 79–97)
Platelets: 215 10*3/uL (ref 150–450)
RBC: 5.06 x10E6/uL (ref 4.14–5.80)
RDW: 12.5 % (ref 11.6–15.4)
WBC: 7.5 10*3/uL (ref 3.4–10.8)

## 2021-06-16 LAB — MICROALBUMIN / CREATININE URINE RATIO
Creatinine, Urine: 174.6 mg/dL
Microalb/Creat Ratio: 8 mg/g creat (ref 0–29)
Microalbumin, Urine: 14.8 ug/mL

## 2021-06-16 LAB — TSH: TSH: 3.37 u[IU]/mL (ref 0.450–4.500)

## 2021-06-16 LAB — HEMOGLOBIN A1C
Est. average glucose Bld gHb Est-mCnc: 160 mg/dL
Hgb A1c MFr Bld: 7.2 % — ABNORMAL HIGH (ref 4.8–5.6)

## 2021-06-16 LAB — LIPID PANEL
Chol/HDL Ratio: 4.7 ratio (ref 0.0–5.0)
Cholesterol, Total: 178 mg/dL (ref 100–199)
HDL: 38 mg/dL — ABNORMAL LOW (ref >39)
LDL Chol Calc (NIH): 109 mg/dL — ABNORMAL HIGH (ref 0–99)
Triglycerides: 178 mg/dL — ABNORMAL HIGH (ref 0–149)
VLDL Cholesterol Cal: 31 mg/dL (ref 5–40)

## 2021-06-16 MED ORDER — BUDESONIDE-FORMOTEROL FUMARATE 160-4.5 MCG/ACT IN AERO: 2.0000 | INHALATION_SPRAY | Freq: Two times a day (BID) | RESPIRATORY_TRACT | 3 refills | Status: DC

## 2021-06-16 MED ORDER — METFORMIN HCL 850 MG PO TABS: 850.0000 mg | ORAL_TABLET | Freq: Two times a day (BID) | ORAL | 1 refills | Status: DC

## 2021-06-17 ENCOUNTER — Other Ambulatory Visit: Payer: Self-pay | Admitting: Family Medicine

## 2021-06-17 MED ORDER — ROSUVASTATIN CALCIUM 10 MG PO TABS: 10.0000 mg | ORAL_TABLET | Freq: Every day | ORAL | 1 refills | Status: DC

## 2021-06-17 MED ORDER — METFORMIN HCL 1000 MG PO TABS: 1000.0000 mg | ORAL_TABLET | Freq: Two times a day (BID) | ORAL | 1 refills | Status: DC

## 2021-06-27 ENCOUNTER — Other Ambulatory Visit: Payer: Self-pay | Admitting: Family Medicine

## 2021-06-27 DIAGNOSIS — E785 Hyperlipidemia, unspecified: Secondary | ICD-10-CM

## 2021-06-28 ENCOUNTER — Encounter: Payer: Self-pay | Admitting: Dermatology

## 2021-06-28 NOTE — Progress Notes (Signed)
Follow-Up Visit   Subjective  Cristian Nguyen is a 65 y.o. male who presents for the following: Annual Exam (Mid forehead- scaly spot & left cheek- bump).  General skin examination, several new spots on face Location:  Duration:  Quality:  Associated Signs/Symptoms: Modifying Factors:  Severity:  Timing: Context:   Objective  Well appearing patient in no apparent distress; mood and affect are within normal limits. Torso - Posterior (Back) Multiple 2 mm smooth red dermal papules  Torso - Posterior (Back) Waist up skin examination: No atypical pigmented lesions.  Several possible basal cell carcinomas will be biopsied.  Mid Forehead 7 mm focally eroded pearly pink papule       Left Cheek 6 mm pearly pink papule in       Right Buccal Cheek 4 mm pearly pink papule and a hypopigmented scar, so possibly recurrent       Left Axilla 2 mm pedunculated flesh-colored papule    A full examination was performed including scalp, head, eyes, ears, nose, lips, neck, chest, axillae, abdomen, back, buttocks, bilateral upper extremities, bilateral lower extremities, hands, feet, fingers, toes, fingernails, and toenails. All findings within normal limits unless otherwise noted below.   Assessment & Plan    Cherry angioma Torso - Posterior (Back)  Benign, ok to leave unless patient wants removed  Encounter for screening for malignant neoplasm of skin Torso - Posterior (Back)  Annual skin examination, encouraged to self examine twice annually.  Continue ultraviolet protection.  Neoplasm of uncertain behavior of skin (3) Mid Forehead  Skin / nail biopsy Type of biopsy: tangential   Informed consent: discussed and consent obtained   Timeout: patient name, date of birth, surgical site, and procedure verified   Anesthesia: the lesion was anesthetized in a standard fashion   Anesthetic:  1% lidocaine w/ epinephrine 1-100,000 local infiltration Instrument used:  flexible razor blade   Hemostasis achieved with: aluminum chloride and electrodesiccation   Outcome: patient tolerated procedure well   Post-procedure details: wound care instructions given    Specimen 1 - Surgical pathology Differential Diagnosis: bcc vs scc  Check Margins: No  Left Cheek  Skin / nail biopsy Type of biopsy: tangential   Informed consent: discussed and consent obtained   Timeout: patient name, date of birth, surgical site, and procedure verified   Procedure prep:  Patient was prepped and draped in usual sterile fashion (Non sterile) Prep type:  Chlorhexidine Anesthesia: the lesion was anesthetized in a standard fashion   Anesthetic:  1% lidocaine w/ epinephrine 1-100,000 local infiltration Instrument used: flexible razor blade   Outcome: patient tolerated procedure well   Post-procedure details: wound care instructions given    Specimen 2 - Surgical pathology Differential Diagnosis: bcc vs scc [id number] Check Margins: No  Right Buccal Cheek  Skin / nail biopsy Type of biopsy: tangential   Informed consent: discussed and consent obtained   Timeout: patient name, date of birth, surgical site, and procedure verified   Procedure prep:  Patient was prepped and draped in usual sterile fashion (Non sterile) Prep type:  Chlorhexidine Anesthesia: the lesion was anesthetized in a standard fashion   Anesthetic:  1% lidocaine w/ epinephrine 1-100,000 local infiltration Instrument used: flexible razor blade   Outcome: patient tolerated procedure well   Post-procedure details: wound care instructions given    Specimen 3 - Surgical pathology Differential Diagnosis: bcc vs scc  Check Margins: No  Skin tag Left Axilla  May choose to have removed in future  I, Lavonna Monarch, MD, have reviewed all documentation for this visit.  The documentation on 06/28/21 for the exam, diagnosis, procedures, and orders are all accurate and complete.

## 2021-07-20 LAB — HM DIABETES EYE EXAM

## 2021-07-27 ENCOUNTER — Other Ambulatory Visit: Payer: Self-pay | Admitting: Family Medicine

## 2021-07-27 DIAGNOSIS — E785 Hyperlipidemia, unspecified: Secondary | ICD-10-CM

## 2021-08-06 ENCOUNTER — Other Ambulatory Visit: Payer: Self-pay

## 2021-08-06 ENCOUNTER — Ambulatory Visit (INDEPENDENT_AMBULATORY_CARE_PROVIDER_SITE_OTHER): Payer: 59 | Admitting: Dermatology

## 2021-08-06 ENCOUNTER — Encounter: Payer: Self-pay | Admitting: Dermatology

## 2021-08-06 DIAGNOSIS — C44319 Basal cell carcinoma of skin of other parts of face: Secondary | ICD-10-CM

## 2021-08-12 ENCOUNTER — Telehealth: Payer: Self-pay | Admitting: Family Medicine

## 2021-08-12 DIAGNOSIS — E119 Type 2 diabetes mellitus without complications: Secondary | ICD-10-CM

## 2021-08-12 NOTE — Telephone Encounter (Signed)
Pt had lab work in December. He has a 3 month fu with Dr Lacinda Axon in March and was wondering if he needed lab work before his appt.   [phone]

## 2021-08-12 NOTE — Telephone Encounter (Signed)
Last labs completed 06/15/21 TSH, URINE MICRO, LIPID, A1C, CMP14+EGR, CBC. Please advise. Thank you

## 2021-08-12 NOTE — Telephone Encounter (Signed)
Lab orders placed. Pt is aware

## 2021-08-13 ENCOUNTER — Telehealth: Payer: Self-pay | Admitting: Dermatology

## 2021-08-13 NOTE — Telephone Encounter (Signed)
Patient is calling to say that he was told to call office for a referral appointment if [facility] has not contacted him.

## 2021-08-13 NOTE — Telephone Encounter (Signed)
Phone call to patient to inform him that it will be about 3 weeks before he hears anything from [facility]. Patient wanted to now if I could text him the number. I informed patient I would sent the number through MyChart for him to give them a call.

## 2021-08-29 ENCOUNTER — Encounter: Payer: Self-pay | Admitting: Dermatology

## 2021-08-29 NOTE — Progress Notes (Signed)
° °  Follow-Up Visit   Subjective  [name] is a 66 y.o. male who presents for the following: Procedure (Patient here today for treatment of BCC x 3 mid forehead, left cheek and right buccal cheek.).  Discussed and possibly treat 3 biopsy-proven basal cell carcinomas. Location:  Duration:  Quality:  Associated Signs/Symptoms: Modifying Factors:  Severity:  Timing: Context:   Objective  Well appearing patient in no apparent distress; mood and affect are within normal limits. Mid Forehead Lesion identified by Dr.Yeriel Mineo and nurse in room.   REFER TO DR. ALBERTINI - MOHS PER ST  Left Cheek Lesion identified by Dr.Mendel Binsfeld and nurse in room.   REFER TO DR. ALBERTINI - MOHS PER ST  Right Buccal Cheek Lesion identified by Dr.Leandro Berkowitz and nurse in room.   REFER TO DR. ALBERTINI - MOHS PER ST    A focused examination was performed including head and neck. Relevant physical exam findings are noted in the Assessment and Plan.   Assessment & Plan    Basal cell carcinoma (BCC) of forehead (3) Mid Forehead  Left Cheek  Right Buccal Cheek  Although these are histologically nodular BCCs, the margins are not well-defined and I think removal by Mohs surgery would be appropriate.  This was discussed in detail with [name]. Release [id number] Completed with [facility] Dr Donaciano Eva, Lavonna Monarch, MD, have reviewed all documentation for this visit.  The documentation on 08/29/21 for the exam, diagnosis, procedures, and orders are all accurate and complete.

## 2021-09-15 LAB — HEMOGLOBIN A1C
Est. average glucose Bld gHb Est-mCnc: 174 mg/dL
Hgb A1c MFr Bld: 7.7 % — ABNORMAL HIGH (ref 4.8–5.6)

## 2021-09-16 ENCOUNTER — Other Ambulatory Visit: Payer: Self-pay

## 2021-09-16 ENCOUNTER — Ambulatory Visit: Payer: 59 | Admitting: Family Medicine

## 2021-09-16 VITALS — BP 132/82 | HR 92 | Ht 74.0 in | Wt 267.8 lb

## 2021-09-16 DIAGNOSIS — E782 Mixed hyperlipidemia: Secondary | ICD-10-CM

## 2021-09-16 DIAGNOSIS — I10 Essential (primary) hypertension: Secondary | ICD-10-CM | POA: Diagnosis not present

## 2021-09-16 DIAGNOSIS — E119 Type 2 diabetes mellitus without complications: Secondary | ICD-10-CM | POA: Diagnosis not present

## 2021-09-16 MED ORDER — ATORVASTATIN CALCIUM 20 MG PO TABS: 20.0000 mg | ORAL_TABLET | Freq: Every day | ORAL | 3 refills | Status: DC

## 2021-09-16 NOTE — Patient Instructions (Signed)
Stop the rosuvastatin.  Start atorvastatin.  Please let me know if you continue to have an increase in aches and pains. ? ?We will recheck an A1c and recheck your cholesterol just before your visit. ? ?Follow up in 3 months. ?

## 2021-09-16 NOTE — Assessment & Plan Note (Signed)
Hypertension stable.  Continue lisinopril. ?

## 2021-09-16 NOTE — Assessment & Plan Note (Signed)
A1c has risen from 7.2-7.7.  No medication change at this time.  Patient will work on his diet.  He drinks soft drinks and sweet tea which is likely the culprit for his elevated A1c.  He will continue his glipizide and metformin. ?

## 2021-09-16 NOTE — Progress Notes (Signed)
? ?Subjective:  ?Patient ID: Cristian Nguyen, male    DOB: 1955/09/05  Age: 66 y.o. MRN: [id number] ? ?CC: ?Chief Complaint  ?Patient presents with  ? Diabetes  ?  Follow up ?Patient states since changing cholesterol medication to Crestor he has been having a lot of joint pain  ? ? ?HPI: ? ?66 year old male with hypertension, hyperlipidemia, type 2 diabetes, hypothyroidism presents for follow-up. ? ?A1c has returned elevated at 7.7.  He is compliant with metformin and glipizide.  He states that he drinks sweet tea and soft drinks.  He feels that this is likely the culprit for his elevated A1c.  Additionally, he has not been quite as active as the weather has not permitted him to play golf. ? ?Patient states that since switching from simvastatin to rosuvastatin he has experienced more aches and pains.  He is concerned that this is coming from the rosuvastatin.  Would like to discuss this today. ? ?Blood pressure well controlled on lisinopril. ? ?Patient Active Problem List  ? Diagnosis Date Noted  ? Hyperlipidemia, mixed 09/16/2021  ? Post-operative hypothyroidism 06/15/2021  ? 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 History of colon polyps   ? Type 2 diabetes mellitus without complication ([place]) 40/98/1191  ? Esophageal reflux 05/08/2013  ? Essential hypertension, benign 05/08/2013  ? ? ?Social Hx   ?Social History  ? ?Socioeconomic History  ? Marital status: Married  ?  Spouse name: Not on file  ? Number of children: Not on file  ? Years of education: Not on file  ? Highest education level: Not on file  ?Occupational History  ? Not on file  ?Tobacco Use  ? Smoking status: Former  ?  Years: 35.00  ?  Types: Cigarettes  ?  Quit date: 12/09/2006  ?  Years since quitting: 14.7  ? Smokeless tobacco: Never  ?Vaping Use  ? Vaping Use: Never used  ?Substance and Sexual Activity  ? Alcohol use: Yes  ?  Comment: occasional  ? Drug use: Never  ? Sexual activity: Not Currently  ?Other Topics Concern  ? Not on file  ?Social  History Narrative  ? Not on file  ? ?Social Determinants of Health  ? ?Financial Resource Strain: Not on file  ?Food Insecurity: Not on file  ?Transportation Needs: Not on file  ?Physical Activity: Not on file  ?Stress: Not on file  ?Social Connections: Not on file  ? ? ?Review of Systems  ?Constitutional: Negative.   ?Respiratory:  Negative for cough.   ?Musculoskeletal:  Positive for arthralgias.  ? ?Objective:  ?BP 132/82   Pulse 92   Ht '6\' 2"'$  (1.88 m)   Wt 267 lb 12.8 oz (121.5 kg)   SpO2 98%   BMI 34.38 kg/m?  ? ?BP/Weight 09/16/2021 06/15/2021 12/24/2020  ?Systolic BP 478 295 621  ?Diastolic BP 82 78 93  ?Wt. (Lbs) 267.8 269.4 272.4  ?BMI 34.38 34.59 34.97  ? ? ?Physical Exam ?Vitals and nursing note reviewed.  ?Constitutional:   ?   General: He is not in acute distress. ?   Appearance: Normal appearance. He is not ill-appearing.  ?HENT:  ?   Head: Normocephalic and atraumatic.  ?Eyes:  ?   General:     ?   Right eye: No discharge.     ?   Left eye: No discharge.  ?   Conjunctiva/sclera: Conjunctivae normal.  ?Cardiovascular:  ?   Rate and Rhythm: Normal rate and regular rhythm.  ?Pulmonary:  ?  Effort: Pulmonary effort is normal.  ?   Breath sounds: Normal breath sounds. No wheezing, rhonchi or rales.  ?Neurological:  ?   Mental Status: He is alert.  ?Psychiatric:     ?   Mood and Affect: Mood normal.     ?   Behavior: Behavior normal.  ? ? ?Lab Results  ?Component Value Date  ? WBC 7.5 06/15/2021  ? HGB 15.4 06/15/2021  ? HCT 44.2 06/15/2021  ? PLT 215 06/15/2021  ? GLUCOSE 146 (H) 06/15/2021  ? CHOL 178 06/15/2021  ? TRIG 178 (H) 06/15/2021  ? HDL 38 (L) 06/15/2021  ? LDLCALC 109 (H) 06/15/2021  ? ALT 38 06/15/2021  ? AST 26 06/15/2021  ? NA 140 06/15/2021  ? K 4.3 06/15/2021  ? CL 99 06/15/2021  ? CREATININE 0.99 06/15/2021  ? BUN 11 06/15/2021  ? CO2 25 06/15/2021  ? TSH 3.370 06/15/2021  ? PSA 1.14 05/02/2013  ? HGBA1C 7.7 (H) 09/14/2021  ? ? ? ?Assessment & Plan:  ? ?Problem List Items Addressed  This Visit   ? ?  ? Cardiovascular and Mediastinum  ? Essential hypertension, benign  ?  Hypertension stable.  Continue lisinopril. ?  ?  ? Relevant Medications  ? atorvastatin (LIPITOR) 20 MG tablet  ?  ? Endocrine  ? Type 2 diabetes mellitus without complication (HCC) - Primary  ?  A1c has risen from 7.2-7.7.  No medication change at this time.  Patient will work on his diet.  He drinks soft drinks and sweet tea which is likely the culprit for his elevated A1c.  He will continue his glipizide and metformin. ?  ?  ? Relevant Medications  ? atorvastatin (LIPITOR) 20 MG tablet  ? Other Relevant Orders  ? Hemoglobin A1c  ?  ? Other  ? Hyperlipidemia, mixed  ?  Stopping Crestor and starting Lipitor due to increase in arthralgias.  Will reassess with lipid panel prior to next visit in 3 months. ?  ?  ? Relevant Medications  ? atorvastatin (LIPITOR) 20 MG tablet  ? Other Relevant Orders  ? Lipid panel  ? ? ?Meds ordered this encounter  ?Medications  ? atorvastatin (LIPITOR) 20 MG tablet  ?  Sig: Take 1 tablet (20 mg total) by mouth daily.  ?  Dispense:  90 tablet  ?  Refill:  3  ? ? ?Follow-up:  Return in about 3 months (around 12/17/2021) for Diabetes follow up. ? ?Thersa Salt DO ?[place] ? ?

## 2021-09-16 NOTE — Assessment & Plan Note (Signed)
Stopping Crestor and starting Lipitor due to increase in arthralgias.  Will reassess with lipid panel prior to next visit in 3 months. ?

## 2021-12-15 LAB — LIPID PANEL
Chol/HDL Ratio: 4.5 ratio (ref 0.0–5.0)
Cholesterol, Total: 145 mg/dL (ref 100–199)
HDL: 32 mg/dL — ABNORMAL LOW (ref >39)
LDL Chol Calc (NIH): 79 mg/dL (ref 0–99)
Triglycerides: 204 mg/dL — ABNORMAL HIGH (ref 0–149)
VLDL Cholesterol Cal: 34 mg/dL (ref 5–40)

## 2021-12-15 LAB — HEMOGLOBIN A1C
Est. average glucose Bld gHb Est-mCnc: 186 mg/dL
Hgb A1c MFr Bld: 8.1 % — ABNORMAL HIGH (ref 4.8–5.6)

## 2021-12-17 ENCOUNTER — Ambulatory Visit (INDEPENDENT_AMBULATORY_CARE_PROVIDER_SITE_OTHER): Payer: PPO | Admitting: Family Medicine

## 2021-12-17 VITALS — BP 145/98 | HR 96 | Temp 98.3°F | Ht 74.0 in | Wt 270.4 lb

## 2021-12-17 DIAGNOSIS — E890 Postprocedural hypothyroidism: Secondary | ICD-10-CM | POA: Diagnosis not present

## 2021-12-17 DIAGNOSIS — K219 Gastro-esophageal reflux disease without esophagitis: Secondary | ICD-10-CM | POA: Diagnosis not present

## 2021-12-17 DIAGNOSIS — E119 Type 2 diabetes mellitus without complications: Secondary | ICD-10-CM

## 2021-12-17 DIAGNOSIS — I10 Essential (primary) hypertension: Secondary | ICD-10-CM | POA: Diagnosis not present

## 2021-12-17 DIAGNOSIS — E782 Mixed hyperlipidemia: Secondary | ICD-10-CM

## 2021-12-17 MED ORDER — METFORMIN HCL 1000 MG PO TABS: 1000.0000 mg | ORAL_TABLET | Freq: Two times a day (BID) | ORAL | 3 refills | Status: DC

## 2021-12-17 MED ORDER — ESOMEPRAZOLE MAGNESIUM 40 MG PO CPDR: DELAYED_RELEASE_CAPSULE | ORAL | 3 refills | Status: DC

## 2021-12-17 MED ORDER — LISINOPRIL 5 MG PO TABS: 5.0000 mg | ORAL_TABLET | Freq: Two times a day (BID) | ORAL | 3 refills | Status: DC

## 2021-12-17 MED ORDER — GLIPIZIDE 10 MG PO TABS: 10.0000 mg | ORAL_TABLET | Freq: Two times a day (BID) | ORAL | 3 refills | Status: DC

## 2021-12-17 MED ORDER — LEVOTHYROXINE SODIUM 50 MCG PO TABS: 50.0000 ug | ORAL_TABLET | Freq: Every day | ORAL | 3 refills | Status: DC

## 2021-12-17 NOTE — Patient Instructions (Signed)
Medications as directed.  Follow up in 3 months.  Take care  Dr. Lacinda Axon

## 2021-12-17 NOTE — Assessment & Plan Note (Signed)
Worsening/uncontrolled. Discuss treatment options. Patient elected to not start GLP-1 or SGLT-2.  Increased Glipizide to 10 mg BID. Continue Metformin. Follow up in 3 months.

## 2021-12-17 NOTE — Progress Notes (Signed)
Subjective:  Patient ID: Cristian Nguyen, male    DOB: Apr 01, 1956  Age: 66 y.o. MRN: [id number]  CC: Chief Complaint  Patient presents with   Diabetes    3 month follow up. Pt had labs done on 12/14/21    HPI:  66 year old male with GERD, HTN, Hypothyroidism, DM-2, HLD presents for follow up.  He states that he is feeling well. No chest pain or SOB.  A1C has returned. Control worsening, 7.7 > 8.1. He endorses compliance with Metformin (1000 mg BID) and Glipizide 10 mg daily.  No recent hypoglycemia.   LDL well controlled (79). Triglycerides elevated (likely due to uncontrolled DM.  No side effects from statin.   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   History of colon polyps    Type 2 diabetes mellitus without complication ([place]) 53/97/6734   Esophageal reflux 05/08/2013   Essential hypertension, benign 05/08/2013    Social Hx   Social History   Socioeconomic History   Marital status: Married    Spouse name: Not on file   Number of children: Not on file   Years of education: Not on file   Highest education level: Not on file  Occupational History   Not on file  Tobacco Use   Smoking status: Former    Years: 35.00    Types: Cigarettes    Quit date: 12/09/2006    Years since quitting: 15.0   Smokeless tobacco: Never  Vaping Use   Vaping Use: Never used  Substance and Sexual Activity   Alcohol use: Yes    Comment: occasional   Drug use: Never   Sexual activity: Not Currently  Other Topics Concern   Not on file  Social History Narrative   Not on file   Social Determinants of Health   Financial Resource Strain: Not on file  Food Insecurity: Not on file  Transportation Needs: Not on file  Physical Activity: Not on file  Stress: Not on file  Social Connections: Not on file    Review of Systems Per HPI  Objective:  BP (!)  145/98   Pulse 96   Temp 98.3 F (36.8 C) (Oral)   Ht '6\' 2"'$  (1.88 m)   Wt 270 lb 6.4 oz (122.7 kg)   SpO2 96%   BMI 34.72 kg/m      12/17/2021    8:25 AM 09/16/2021    8:23 AM 06/15/2021    8:46 AM  BP/Weight  Systolic BP 193 790 240  Diastolic BP 98 82 78  Wt. (Lbs) 270.4 267.8 269.4  BMI 34.72 kg/m2 34.38 kg/m2 34.59 kg/m2    Physical Exam Vitals and nursing note reviewed.  Constitutional:      Appearance: Normal appearance. He is obese.  HENT:     Head: Normocephalic and atraumatic.  Eyes:     Conjunctiva/sclera: Conjunctivae normal.  Cardiovascular:     Rate and Rhythm: Normal rate and regular rhythm.  Pulmonary:     Effort: Pulmonary effort is normal.     Breath sounds: Normal breath sounds.  Neurological:     Mental Status: He is alert.  Psychiatric:        Mood and Affect: Mood normal.        Behavior: Behavior normal.     Lab Results  Component Value Date   WBC 7.5 06/15/2021   HGB 15.4 06/15/2021  HCT 44.2 06/15/2021   PLT 215 06/15/2021   GLUCOSE 146 (H) 06/15/2021   CHOL 145 12/14/2021   TRIG 204 (H) 12/14/2021   HDL 32 (L) 12/14/2021   LDLCALC 79 12/14/2021   ALT 38 06/15/2021   AST 26 06/15/2021   NA 140 06/15/2021   K 4.3 06/15/2021   CL 99 06/15/2021   CREATININE 0.99 06/15/2021   BUN 11 06/15/2021   CO2 25 06/15/2021   TSH 3.370 06/15/2021   PSA 1.14 05/02/2013   HGBA1C 8.1 (H) 12/14/2021     Assessment & Plan:   Problem List Items Addressed This Visit       Cardiovascular and Mediastinum   Essential hypertension, benign   Relevant Medications   lisinopril (ZESTRIL) 5 MG tablet     Digestive   Esophageal reflux   Relevant Medications   esomeprazole (NEXIUM) 40 MG capsule     Endocrine   Type 2 diabetes mellitus without complication ([place]) - Primary    Worsening/uncontrolled. Discuss treatment options. Patient elected to not start GLP-1 or SGLT-2.  Increased Glipizide to 10 mg BID. Continue Metformin. Follow up in 3  months.       Relevant Medications   lisinopril (ZESTRIL) 5 MG tablet   metFORMIN (GLUCOPHAGE) 1000 MG tablet   glipiZIDE (GLUCOTROL) 10 MG tablet     Other   Hyperlipidemia, mixed    Continue statin. Needs better control of triglycerides.       Relevant Medications   lisinopril (ZESTRIL) 5 MG tablet   Other Visit Diagnoses     Postoperative hypothyroidism       Relevant Medications   levothyroxine (SYNTHROID) 50 MCG tablet       Meds ordered this encounter  Medications   esomeprazole (NEXIUM) 40 MG capsule    Sig: TAKE ONE CAPSULE BY MOUTH ONCE DAILY BEFORE BREAKFAST    Dispense:  90 capsule    Refill:  3   levothyroxine (SYNTHROID) 50 MCG tablet    Sig: Take 1 tablet (50 mcg total) by mouth daily.    Dispense:  90 tablet    Refill:  3   lisinopril (ZESTRIL) 5 MG tablet    Sig: Take 1 tablet (5 mg total) by mouth 2 (two) times daily.    Dispense:  180 tablet    Refill:  3   metFORMIN (GLUCOPHAGE) 1000 MG tablet    Sig: Take 1 tablet (1,000 mg total) by mouth 2 (two) times daily with a meal.    Dispense:  180 tablet    Refill:  3   glipiZIDE (GLUCOTROL) 10 MG tablet    Sig: Take 1 tablet (10 mg total) by mouth 2 (two) times daily before a meal.    Dispense:  180 tablet    Refill:  3    Follow-up:  Return in about 3 months (around 03/19/2022) for Diabetes follow up.  [place]

## 2021-12-17 NOTE — Assessment & Plan Note (Signed)
Continue statin. Needs better control of triglycerides.

## 2021-12-29 ENCOUNTER — Other Ambulatory Visit: Payer: Self-pay | Admitting: Family Medicine

## 2021-12-29 DIAGNOSIS — E119 Type 2 diabetes mellitus without complications: Secondary | ICD-10-CM

## 2022-02-05 ENCOUNTER — Other Ambulatory Visit: Payer: Self-pay | Admitting: *Deleted

## 2022-02-05 NOTE — Progress Notes (Signed)
Pharmacy stated patient's insurance no longer covers Accucheck Guide strips and prefers one touch system with meter, strips and lancets  Patient notified and stated he wanted to finish using up what he has and will call us back when he wants the new system sent to pharmacy.

## 2022-03-12 ENCOUNTER — Telehealth: Payer: Self-pay | Admitting: Family Medicine

## 2022-03-12 DIAGNOSIS — I10 Essential (primary) hypertension: Secondary | ICD-10-CM

## 2022-03-12 DIAGNOSIS — Z125 Encounter for screening for malignant neoplasm of prostate: Secondary | ICD-10-CM

## 2022-03-12 DIAGNOSIS — E119 Type 2 diabetes mellitus without complications: Secondary | ICD-10-CM

## 2022-03-12 DIAGNOSIS — E782 Mixed hyperlipidemia: Secondary | ICD-10-CM

## 2022-03-12 DIAGNOSIS — Z79899 Other long term (current) drug therapy: Secondary | ICD-10-CM

## 2022-03-12 NOTE — Telephone Encounter (Signed)
Patient has appointment on 9/14 for diabetes follow up and was told to have labs done  for his follow up  but nothing has been put in system yet . He wanting to do labs on 9/12.

## 2022-03-12 NOTE — Telephone Encounter (Signed)
Last  labs 12/14/21: Lipid and HgbA1c

## 2022-03-12 NOTE — Telephone Encounter (Signed)
Cook, Jayce G, DO     CBC, CMP, Lipid, A1C, Urine microalbumin, PSA

## 2022-03-12 NOTE — Telephone Encounter (Signed)
Lab orders placed and pt is aware 

## 2022-03-17 LAB — CMP14+EGFR
ALT: 31 IU/L (ref 0–44)
AST: 21 IU/L (ref 0–40)
Albumin/Globulin Ratio: 2 (ref 1.2–2.2)
Albumin: 4.5 g/dL (ref 3.9–4.9)
Alkaline Phosphatase: 70 IU/L (ref 44–121)
BUN/Creatinine Ratio: 13 (ref 10–24)
BUN: 13 mg/dL (ref 8–27)
Bilirubin Total: 0.6 mg/dL (ref 0.0–1.2)
CO2: 28 mmol/L (ref 20–29)
Calcium: 9.6 mg/dL (ref 8.6–10.2)
Chloride: 99 mmol/L (ref 96–106)
Creatinine, Ser: 1.04 mg/dL (ref 0.76–1.27)
Globulin, Total: 2.3 g/dL (ref 1.5–4.5)
Glucose: 141 mg/dL — ABNORMAL HIGH (ref 70–99)
Potassium: 4.4 mmol/L (ref 3.5–5.2)
Sodium: 140 mmol/L (ref 134–144)
Total Protein: 6.8 g/dL (ref 6.0–8.5)
eGFR: 80 mL/min/{1.73_m2} (ref >59)

## 2022-03-17 LAB — CBC WITH DIFFERENTIAL/PLATELET
Basophils Absolute: 0 10*3/uL (ref 0.0–0.2)
Basos: 1 %
EOS (ABSOLUTE): 0.3 10*3/uL (ref 0.0–0.4)
Eos: 4 %
Hematocrit: 43.1 % (ref 37.5–51.0)
Hemoglobin: 15.1 g/dL (ref 13.0–17.7)
Immature Grans (Abs): 0 10*3/uL (ref 0.0–0.1)
Immature Granulocytes: 0 %
Lymphocytes Absolute: 2.5 10*3/uL (ref 0.7–3.1)
Lymphs: 37 %
MCH: 30.3 pg (ref 26.6–33.0)
MCHC: 35 g/dL (ref 31.5–35.7)
MCV: 87 fL (ref 79–97)
Monocytes Absolute: 0.4 10*3/uL (ref 0.1–0.9)
Monocytes: 6 %
Neutrophils Absolute: 3.7 10*3/uL (ref 1.4–7.0)
Neutrophils: 52 %
Platelets: 223 10*3/uL (ref 150–450)
RBC: 4.98 x10E6/uL (ref 4.14–5.80)
RDW: 11.9 % (ref 11.6–15.4)
WBC: 6.9 10*3/uL (ref 3.4–10.8)

## 2022-03-17 LAB — LIPID PANEL
Chol/HDL Ratio: 4.3 ratio (ref 0.0–5.0)
Cholesterol, Total: 151 mg/dL (ref 100–199)
HDL: 35 mg/dL — ABNORMAL LOW (ref >39)
LDL Chol Calc (NIH): 80 mg/dL (ref 0–99)
Triglycerides: 214 mg/dL — ABNORMAL HIGH (ref 0–149)
VLDL Cholesterol Cal: 36 mg/dL (ref 5–40)

## 2022-03-17 LAB — MICROALBUMIN / CREATININE URINE RATIO
Creatinine, Urine: 195.4 mg/dL
Microalb/Creat Ratio: 13 mg/g creat (ref 0–29)
Microalbumin, Urine: 24.5 ug/mL

## 2022-03-17 LAB — PSA: Prostate Specific Ag, Serum: 1.3 ng/mL (ref 0.0–4.0)

## 2022-03-17 LAB — HEMOGLOBIN A1C
Est. average glucose Bld gHb Est-mCnc: 163 mg/dL
Hgb A1c MFr Bld: 7.3 % — ABNORMAL HIGH (ref 4.8–5.6)

## 2022-03-18 ENCOUNTER — Ambulatory Visit (INDEPENDENT_AMBULATORY_CARE_PROVIDER_SITE_OTHER): Payer: PPO | Admitting: Family Medicine

## 2022-03-18 VITALS — BP 138/86 | HR 75 | Temp 98.2°F | Ht 74.0 in | Wt 269.0 lb

## 2022-03-18 DIAGNOSIS — I10 Essential (primary) hypertension: Secondary | ICD-10-CM | POA: Diagnosis not present

## 2022-03-18 DIAGNOSIS — E782 Mixed hyperlipidemia: Secondary | ICD-10-CM | POA: Diagnosis not present

## 2022-03-18 DIAGNOSIS — Z0000 Encounter for general adult medical examination without abnormal findings: Secondary | ICD-10-CM

## 2022-03-18 DIAGNOSIS — E119 Type 2 diabetes mellitus without complications: Secondary | ICD-10-CM | POA: Diagnosis not present

## 2022-03-18 MED ORDER — ATORVASTATIN CALCIUM 20 MG PO TABS: 20.0000 mg | ORAL_TABLET | Freq: Every day | ORAL | 3 refills | Status: DC

## 2022-03-18 NOTE — Progress Notes (Signed)
Subjective:  Patient ID: Cristian Nguyen, male    DOB: 12-28-1955  Age: 66 y.o. MRN: [id number]  CC: Chief Complaint  Patient presents with   Diabetes    HPI:  66 year old male with hypertension, hypothyroidism, hyperlipidemia, type 2 diabetes presents for follow-up.  Glycemic control has improved; A1c has decreased from 8.1-7.3.  He is currently on glipizide 10 mg twice daily and metformin 1000 mg twice daily.  Doing well.  No side effects.  Regarding his hyperlipidemia, triglycerides still mildly elevated.  LDL 80.  Compliant with atorvastatin.  Hypertension is well controlled on lisinopril.  Patient inquiring about the status of his colonoscopy.  Last done in 2019.  It is due in 2024.  Patient Active Problem List   Diagnosis Date Noted   Preventative health care 03/18/2022   Hyperlipidemia, mixed 09/16/2021   Post-operative hypothyroidism 06/15/2021   S/P subtotal thyroidectomy 09/01/2018   History of colon polyps    Type 2 diabetes mellitus without complication ([place]) 23/53/6144   Esophageal reflux 05/08/2013   Essential hypertension, benign 05/08/2013    Social Hx   Social History   Socioeconomic History   Marital status: Married    Spouse name: Not on file   Number of children: Not on file   Years of education: Not on file   Highest education level: Not on file  Occupational History   Not on file  Tobacco Use   Smoking status: Former    Years: 35.00    Types: Cigarettes    Quit date: 12/09/2006    Years since quitting: 15.2   Smokeless tobacco: Never  Vaping Use   Vaping Use: Never used  Substance and Sexual Activity   Alcohol use: Yes    Comment: occasional   Drug use: Never   Sexual activity: Not Currently  Other Topics Concern   Not on file  Social History Narrative   Not on file   Social Determinants of Health   Financial Resource Strain: Not on file  Food Insecurity: Not on file  Transportation Needs: Not on file  Physical Activity: Not on  file  Stress: Not on file  Social Connections: Not on file    Review of Systems  Constitutional: Negative.   Respiratory: Negative.    Cardiovascular: Negative.    Objective:  BP 138/86   Pulse 75   Temp 98.2 F (36.8 C)   Ht '6\' 2"'$  (1.88 m)   Wt 269 lb (122 kg)   SpO2 97%   BMI 34.54 kg/m      03/18/2022    8:14 AM 12/17/2021    8:25 AM 09/16/2021    8:23 AM  BP/Weight  Systolic BP 315 400 867  Diastolic BP 86 98 82  Wt. (Lbs) 269 270.4 267.8  BMI 34.54 kg/m2 34.72 kg/m2 34.38 kg/m2    Physical Exam Constitutional:      General: He is not in acute distress.    Appearance: Normal appearance. He is obese.  HENT:     Head: Normocephalic and atraumatic.  Eyes:     General:        Right eye: No discharge.        Left eye: No discharge.     Conjunctiva/sclera: Conjunctivae normal.  Cardiovascular:     Rate and Rhythm: Normal rate and regular rhythm.  Pulmonary:     Effort: Pulmonary effort is normal.     Breath sounds: Normal breath sounds. No wheezing, rhonchi or rales.  Musculoskeletal:  Comments: Diabetic foot exam performed today.  See quality metrics section.  Neurological:     Mental Status: He is alert.  Psychiatric:        Mood and Affect: Mood normal.        Behavior: Behavior normal.     Lab Results  Component Value Date   WBC 6.9 03/16/2022   HGB 15.1 03/16/2022   HCT 43.1 03/16/2022   PLT 223 03/16/2022   GLUCOSE 141 (H) 03/16/2022   CHOL 151 03/16/2022   TRIG 214 (H) 03/16/2022   HDL 35 (L) 03/16/2022   LDLCALC 80 03/16/2022   ALT 31 03/16/2022   AST 21 03/16/2022   NA 140 03/16/2022   K 4.4 03/16/2022   CL 99 03/16/2022   CREATININE 1.04 03/16/2022   BUN 13 03/16/2022   CO2 28 03/16/2022   TSH 3.370 06/15/2021   PSA 1.14 05/02/2013   HGBA1C 7.3 (H) 03/16/2022     Assessment & Plan:   Problem List Items Addressed This Visit       Cardiovascular and Mediastinum   Essential hypertension, benign    Well-controlled.   Continue lisinopril.      Relevant Medications   atorvastatin (LIPITOR) 20 MG tablet     Endocrine   Type 2 diabetes mellitus without complication (HCC) - Primary    Control is improving.  We elected to continue with lifestyle changes as opposed to adding additional medications today.  Follow-up in 6 months.  Continue metformin and glipizide.      Relevant Medications   atorvastatin (LIPITOR) 20 MG tablet     Other   Hyperlipidemia, mixed    Stable.  Continue Lipitor.  Refill today.      Relevant Medications   atorvastatin (LIPITOR) 20 MG tablet    Meds ordered this encounter  Medications   atorvastatin (LIPITOR) 20 MG tablet    Sig: Take 1 tablet (20 mg total) by mouth daily.    Dispense:  90 tablet    Refill:  3    Follow-up: 6 months  [place]

## 2022-03-18 NOTE — Assessment & Plan Note (Signed)
Well-controlled.  Continue lisinopril. 

## 2022-03-18 NOTE — Patient Instructions (Signed)
Continue your medications.  Follow up in 6 months.  Colonoscopy due in 2024.  Take care  Dr. Lacinda Axon

## 2022-03-18 NOTE — Assessment & Plan Note (Signed)
Control is improving.  We elected to continue with lifestyle changes as opposed to adding additional medications today.  Follow-up in 6 months.  Continue metformin and glipizide.

## 2022-03-18 NOTE — Assessment & Plan Note (Signed)
Stable.  Continue Lipitor.  Refill today.

## 2022-05-05 ENCOUNTER — Ambulatory Visit (INDEPENDENT_AMBULATORY_CARE_PROVIDER_SITE_OTHER): Payer: PPO | Admitting: Nurse Practitioner

## 2022-05-05 ENCOUNTER — Encounter: Payer: Self-pay | Admitting: Nurse Practitioner

## 2022-05-05 VITALS — BP 155/96 | HR 71 | Temp 97.9°F | Ht 74.0 in | Wt 271.0 lb

## 2022-05-05 DIAGNOSIS — Z0000 Encounter for general adult medical examination without abnormal findings: Secondary | ICD-10-CM

## 2022-05-05 NOTE — Progress Notes (Signed)
   Subjective:    Patient ID: Cristian Nguyen, male    DOB: Mar 16, 1956, 66 y.o.   MRN: [id number]  HPI  The patient comes in today for a wellness visit.    A review of their health history was completed.  A review of medications was also completed.  Any needed refills; none  Eating habits: ok  Falls/  MVA accidents in past few months: none  Regular exercise: some  Specialist pt sees on regular basis: none  Preventative health issues were discussed.   Additional concerns: none   Review of Systems  All other systems reviewed and are negative.      Objective:   Physical Exam Vitals reviewed.  Constitutional:      General: He is not in acute distress.    Appearance: Normal appearance. He is normal weight. He is not ill-appearing, toxic-appearing or diaphoretic.  HENT:     Head: Normocephalic and atraumatic.  Cardiovascular:     Rate and Rhythm: Normal rate and regular rhythm.     Pulses: Normal pulses.     Heart sounds: Normal heart sounds. No murmur heard. Pulmonary:     Effort: Pulmonary effort is normal. No respiratory distress.     Breath sounds: Normal breath sounds. No wheezing.  Musculoskeletal:     Cervical back: Normal range of motion and neck supple. No rigidity or tenderness.     Comments: Grossly intact  Lymphadenopathy:     Cervical: No cervical adenopathy.  Skin:    General: Skin is warm.     Capillary Refill: Capillary refill takes less than 2 seconds.  Neurological:     Mental Status: He is alert.     Comments: Grossly intact  Psychiatric:        Mood and Affect: Mood normal.        Behavior: Behavior normal.           Assessment & Plan:  1. Medicare annual wellness visit, initial Adult wellness-complete.wellness physical was conducted today. Importance of diet and exercise were discussed in detail.  Importance of stress reduction and healthy living were discussed.  In addition to this a discussion regarding safety was also covered.   We also reviewed over immunizations and gave recommendations regarding current immunization needed for age.   In addition to this additional areas were also touched on including: Preventative health exams needed:  Colonoscopy up to date Flu vaccine declined today Pneumonia vaccine declined today Foot exam update to date A1c up to date Eye exam up to date Encouraged patient to get second Shingles vaccine  Patient was advised yearly wellness exam

## 2022-06-22 ENCOUNTER — Ambulatory Visit (INDEPENDENT_AMBULATORY_CARE_PROVIDER_SITE_OTHER): Payer: PPO | Admitting: Family Medicine

## 2022-06-22 ENCOUNTER — Encounter: Payer: Self-pay | Admitting: Family Medicine

## 2022-06-22 ENCOUNTER — Ambulatory Visit (HOSPITAL_COMMUNITY)
Admission: RE | Admit: 2022-06-22 | Discharge: 2022-06-22 | Disposition: A | Discharge status: Home / Self Care | Payer: PPO | Source: Ambulatory Visit | Attending: Family Medicine | Admitting: Family Medicine

## 2022-06-22 VITALS — BP 158/96 | HR 95 | Temp 98.5°F | Wt 267.2 lb

## 2022-06-22 DIAGNOSIS — J40 Bronchitis, not specified as acute or chronic: Secondary | ICD-10-CM | POA: Diagnosis not present

## 2022-06-22 DIAGNOSIS — R052 Subacute cough: Secondary | ICD-10-CM | POA: Diagnosis not present

## 2022-06-22 MED ORDER — PROMETHAZINE-DM 6.25-15 MG/5ML PO SYRP: 5.0000 mL | ORAL_SOLUTION | Freq: Four times a day (QID) | ORAL | 0 refills | Status: DC | PRN

## 2022-06-22 MED ORDER — AMOXICILLIN-POT CLAVULANATE 875-125 MG PO TABS: 1.0000 | ORAL_TABLET | Freq: Two times a day (BID) | ORAL | 0 refills | Status: DC

## 2022-06-22 NOTE — Progress Notes (Signed)
Subjective:  Patient ID: Cristian Nguyen, male    DOB: October 14, 1955  Age: 66 y.o. MRN: [id number]  CC: Chief Complaint  Patient presents with   Cough   Nasal Congestion    Chest congestion    HPI:  66 year old male presents for evaluation of the above.  Patient reports that he has been symptomatic for the past 3 weeks.  Initially had low-grade fever, congestion, and bodyaches.  This subsequently improved.  However, he developed cough which has continued to linger.  Productive at times.  He is also having some nasal congestion.  No fever.  No relieving factors.  He is compliant with his Symbicort and albuterol.  No other complaints or concerns at this time.  Patient Active Problem List   Diagnosis Date Noted   Bronchitis 06/22/2022   Preventative health care 03/18/2022   Hyperlipidemia, mixed 09/16/2021   Post-operative hypothyroidism 06/15/2021   S/P subtotal thyroidectomy 09/01/2018   History of colon polyps    Type 2 diabetes mellitus without complication ([place]) 51/70/0174   Esophageal reflux 05/08/2013   Essential hypertension, benign 05/08/2013    Social Hx   Social History   Socioeconomic History   Marital status: Married    Spouse name: Not on file   Number of children: Not on file   Years of education: Not on file   Highest education level: Not on file  Occupational History   Not on file  Tobacco Use   Smoking status: Former    Years: 35.00    Types: Cigarettes    Quit date: 12/09/2006    Years since quitting: 15.5   Smokeless tobacco: Never  Vaping Use   Vaping Use: Never used  Substance and Sexual Activity   Alcohol use: Yes    Comment: occasional   Drug use: Never   Sexual activity: Not Currently  Other Topics Concern   Not on file  Social History Narrative   Not on file   Social Determinants of Health   Financial Resource Strain: Not on file  Food Insecurity: Not on file  Transportation Needs: Not on file  Physical Activity: Not on file   Stress: Not on file  Social Connections: Not on file    Review of Systems Per HPI  Objective:  BP (!) 158/96   Pulse 95   Temp 98.5 F (36.9 C) (Temporal)   Wt 267 lb 3.2 oz (121.2 kg)   SpO2 94%   BMI 34.31 kg/m      06/22/2022   10:59 AM 05/05/2022   10:59 AM 05/05/2022   10:51 AM  BP/Weight  Systolic BP 944 967 591  Diastolic BP 96 96 83  Wt. (Lbs) 267.2  271  BMI 34.31 kg/m2  34.79 kg/m2    Physical Exam Vitals and nursing note reviewed.  Constitutional:      General: He is not in acute distress.    Appearance: Normal appearance. He is obese.  HENT:     Head: Normocephalic and atraumatic.     Mouth/Throat:     Pharynx: No oropharyngeal exudate.  Eyes:     General:        Right eye: No discharge.        Left eye: No discharge.     Conjunctiva/sclera: Conjunctivae normal.  Cardiovascular:     Rate and Rhythm: Normal rate and regular rhythm.  Pulmonary:     Comments: Effort normal.  Coarse breath sounds. Neurological:     Mental Status: He is alert.  Psychiatric:        Mood and Affect: Mood normal.        Behavior: Behavior normal.     Lab Results  Component Value Date   WBC 6.9 03/16/2022   HGB 15.1 03/16/2022   HCT 43.1 03/16/2022   PLT 223 03/16/2022   GLUCOSE 141 (H) 03/16/2022   CHOL 151 03/16/2022   TRIG 214 (H) 03/16/2022   HDL 35 (L) 03/16/2022   LDLCALC 80 03/16/2022   ALT 31 03/16/2022   AST 21 03/16/2022   NA 140 03/16/2022   K 4.4 03/16/2022   CL 99 03/16/2022   CREATININE 1.04 03/16/2022   BUN 13 03/16/2022   CO2 28 03/16/2022   TSH 3.370 06/15/2021   PSA 1.14 05/02/2013   HGBA1C 7.3 (H) 03/16/2022     Assessment & Plan:   Problem List Items Addressed This Visit       Respiratory   Bronchitis - Primary    Given duration and lack of improvement, chest x-ray was obtained.  Chest x-ray was independently reviewed by me.  Interpretation: Normal chest x-ray.  No evidence of pneumonia.  Placing on Augmentin to cover for  secondary bacterial bronchitis.  Promethazine DM for cough.      Other Visit Diagnoses     Subacute cough       Relevant Orders   DG Chest 2 View (Completed)       Meds ordered this encounter  Medications   amoxicillin-clavulanate (AUGMENTIN) 875-125 MG tablet    Sig: Take 1 tablet by mouth 2 (two) times daily.    Dispense:  14 tablet    Refill:  0   promethazine-dextromethorphan (PROMETHAZINE-DM) 6.25-15 MG/5ML syrup    Sig: Take 5 mLs by mouth 4 (four) times daily as needed.    Dispense:  118 mL    Refill:  [place]

## 2022-06-22 NOTE — Assessment & Plan Note (Addendum)
Given duration and lack of improvement, chest x-ray was obtained.  Chest x-ray was independently reviewed by me.  Interpretation: Normal chest x-ray.  No evidence of pneumonia.  Placing on Augmentin to cover for secondary bacterial bronchitis.  Promethazine DM for cough.

## 2022-06-22 NOTE — Patient Instructions (Signed)
Xray today.   We will call with results and send in treatment accordingly.  Take care  Dr. Lacinda Axon

## 2022-07-12 ENCOUNTER — Other Ambulatory Visit: Payer: Self-pay | Admitting: Family Medicine

## 2022-07-12 ENCOUNTER — Telehealth: Payer: Self-pay

## 2022-07-12 NOTE — Telephone Encounter (Signed)
Patient calls is still coughing on and off, a lot of head congestion and drainage , taking alka seltzer every other day was previously given antibiotics and prescription cough syrup, requesting another prescription be sent in to [facility] in [place] .

## 2022-07-13 ENCOUNTER — Ambulatory Visit (INDEPENDENT_AMBULATORY_CARE_PROVIDER_SITE_OTHER): Payer: PPO | Admitting: Family Medicine

## 2022-07-13 VITALS — BP 132/83 | HR 75 | Ht 74.0 in | Wt 268.0 lb

## 2022-07-13 DIAGNOSIS — J019 Acute sinusitis, unspecified: Secondary | ICD-10-CM | POA: Diagnosis not present

## 2022-07-13 DIAGNOSIS — B9689 Other specified bacterial agents as the cause of diseases classified elsewhere: Secondary | ICD-10-CM | POA: Insufficient documentation

## 2022-07-13 MED ORDER — DOXYCYCLINE HYCLATE 100 MG PO TABS: 100.0000 mg | ORAL_TABLET | Freq: Two times a day (BID) | ORAL | 0 refills | Status: DC

## 2022-07-13 NOTE — Assessment & Plan Note (Signed)
Treating with Doxy.

## 2022-07-13 NOTE — Progress Notes (Signed)
Subjective:  Patient ID: Cristian Nguyen, male    DOB: 1956/04/24  Age: 67 y.o. MRN: [id number]  CC: Chief Complaint  Patient presents with   Nasal Congestion    Completed antibiotics , cough syrup,  and xrays, head congestion not clearing    HPI:  67 year old male presents for evaluation of the above.  Continues to have significant sinus pressure and congestion. Recently treated for Bronchitis. Cough has improved but congestion persists. Discolored mucus. No fever.   Patient Active Problem List   Diagnosis Date Noted   Acute bacterial rhinosinusitis 07/13/2022   Preventative health care 03/18/2022   Hyperlipidemia, mixed 09/16/2021   Post-operative hypothyroidism 06/15/2021   S/P subtotal thyroidectomy 09/01/2018   History of colon polyps    Type 2 diabetes mellitus without complication ([place]) 32/67/1245   Esophageal reflux 05/08/2013   Essential hypertension, benign 05/08/2013    Social Hx   Social History   Socioeconomic History   Marital status: Married    Spouse name: Not on file   Number of children: Not on file   Years of education: Not on file   Highest education level: Not on file  Occupational History   Not on file  Tobacco Use   Smoking status: Former    Years: 35.00    Types: Cigarettes    Quit date: 12/09/2006    Years since quitting: 15.6   Smokeless tobacco: Never  Vaping Use   Vaping Use: Never used  Substance and Sexual Activity   Alcohol use: Yes    Comment: occasional   Drug use: Never   Sexual activity: Not Currently  Other Topics Concern   Not on file  Social History Narrative   Not on file   Social Determinants of Health   Financial Resource Strain: Not on file  Food Insecurity: Not on file  Transportation Needs: Not on file  Physical Activity: Not on file  Stress: Not on file  Social Connections: Not on file    Review of Systems Per HPI  Objective:  BP 132/83   Pulse 75   Ht '6\' 2"'$  (1.88 m)   Wt 268 lb (121.6 kg)   SpO2  97%   BMI 34.41 kg/m      07/13/2022    2:27 PM 06/22/2022   10:59 AM 05/05/2022   10:59 AM  BP/Weight  Systolic BP 809 983 382  Diastolic BP 83 96 96  Wt. (Lbs) 268 267.2   BMI 34.41 kg/m2 34.31 kg/m2     Physical Exam Vitals and nursing note reviewed.  Constitutional:      General: He is not in acute distress.    Appearance: Normal appearance.  HENT:     Head: Normocephalic and atraumatic.     Nose: Congestion present.  Cardiovascular:     Rate and Rhythm: Normal rate and regular rhythm.     Heart sounds: No murmur heard. Pulmonary:     Effort: Pulmonary effort is normal.     Breath sounds: Normal breath sounds. No wheezing, rhonchi or rales.  Neurological:     Mental Status: He is alert.  Psychiatric:        Mood and Affect: Mood normal.        Behavior: Behavior normal.     Lab Results  Component Value Date   WBC 6.9 03/16/2022   HGB 15.1 03/16/2022   HCT 43.1 03/16/2022   PLT 223 03/16/2022   GLUCOSE 141 (H) 03/16/2022   CHOL 151 03/16/2022  TRIG 214 (H) 03/16/2022   HDL 35 (L) 03/16/2022   LDLCALC 80 03/16/2022   ALT 31 03/16/2022   AST 21 03/16/2022   NA 140 03/16/2022   K 4.4 03/16/2022   CL 99 03/16/2022   CREATININE 1.04 03/16/2022   BUN 13 03/16/2022   CO2 28 03/16/2022   TSH 3.370 06/15/2021   PSA 1.14 05/02/2013   HGBA1C 7.3 (H) 03/16/2022     Assessment & Plan:   Problem List Items Addressed This Visit       Respiratory   Acute bacterial rhinosinusitis - Primary    Treating with Doxy.      Relevant Medications   doxycycline (VIBRA-TABS) 100 MG tablet    Meds ordered this encounter  Medications   doxycycline (VIBRA-TABS) 100 MG tablet    Sig: Take 1 tablet (100 mg total) by mouth 2 (two) times daily.    Dispense:  20 tablet    Refill:  0    Follow-up:  Return if symptoms worsen or fail to improve.  [place]

## 2022-08-24 ENCOUNTER — Telehealth: Payer: Self-pay

## 2022-08-24 ENCOUNTER — Other Ambulatory Visit: Payer: Self-pay | Admitting: Family Medicine

## 2022-08-24 MED ORDER — NIRMATRELVIR/RITONAVIR (PAXLOVID)TABLET: ORAL_TABLET | ORAL | 0 refills | Status: DC

## 2022-08-24 NOTE — Telephone Encounter (Signed)
Pt made aware per drs orders

## 2022-08-24 NOTE — Telephone Encounter (Signed)
Tested positive for Covid this morning and want to see if something can be called in.   Pharmacy [facility] call back [phone]

## 2022-08-24 NOTE — Telephone Encounter (Signed)
Body aches, chills, low grade 100.5 last night took advil, headache symptoms started yesterday. Covid tested positive yesterday, call in to [facility] in [place] . Please advise

## 2022-09-02 ENCOUNTER — Encounter: Payer: Self-pay | Admitting: Radiology

## 2022-09-08 ENCOUNTER — Telehealth: Payer: Self-pay

## 2022-09-08 NOTE — Telephone Encounter (Signed)
Pt needs blood work has appt on 03/14

## 2022-09-13 ENCOUNTER — Telehealth: Payer: Self-pay

## 2022-09-13 DIAGNOSIS — E782 Mixed hyperlipidemia: Secondary | ICD-10-CM

## 2022-09-13 DIAGNOSIS — Z79899 Other long term (current) drug therapy: Secondary | ICD-10-CM

## 2022-09-13 DIAGNOSIS — E119 Type 2 diabetes mellitus without complications: Secondary | ICD-10-CM

## 2022-09-13 NOTE — Telephone Encounter (Signed)
Pt needs blood work ordered he has appt for this Thursday with Dr Louanna Raw: [phone]

## 2022-09-14 NOTE — Telephone Encounter (Signed)
See previous message : Per Dr Lacinda Axon : Lipid, CMP, CBC, Urine Microalbumin and HgbA1c  Blood work ordered in [facility]. Patient notified

## 2022-09-14 NOTE — Telephone Encounter (Signed)
Coral Spikes, DO     Same labs just no PSA. Please order.  Thank you

## 2022-09-14 NOTE — Telephone Encounter (Signed)
Blood work ordered in EPIC. Patient notified. 

## 2022-09-16 ENCOUNTER — Ambulatory Visit (INDEPENDENT_AMBULATORY_CARE_PROVIDER_SITE_OTHER): Payer: PPO | Admitting: Family Medicine

## 2022-09-16 DIAGNOSIS — K219 Gastro-esophageal reflux disease without esophagitis: Secondary | ICD-10-CM

## 2022-09-16 DIAGNOSIS — E119 Type 2 diabetes mellitus without complications: Secondary | ICD-10-CM

## 2022-09-16 DIAGNOSIS — I10 Essential (primary) hypertension: Secondary | ICD-10-CM | POA: Diagnosis not present

## 2022-09-16 DIAGNOSIS — E890 Postprocedural hypothyroidism: Secondary | ICD-10-CM

## 2022-09-16 DIAGNOSIS — E782 Mixed hyperlipidemia: Secondary | ICD-10-CM

## 2022-09-16 MED ORDER — GLIPIZIDE 10 MG PO TABS: 10.0000 mg | ORAL_TABLET | Freq: Two times a day (BID) | ORAL | 3 refills | Status: DC

## 2022-09-16 MED ORDER — METFORMIN HCL 1000 MG PO TABS: 1000.0000 mg | ORAL_TABLET | Freq: Two times a day (BID) | ORAL | 3 refills | Status: DC

## 2022-09-16 MED ORDER — ATORVASTATIN CALCIUM 20 MG PO TABS: 20.0000 mg | ORAL_TABLET | Freq: Every day | ORAL | 3 refills | Status: DC

## 2022-09-16 MED ORDER — LEVOTHYROXINE SODIUM 50 MCG PO TABS: 50.0000 ug | ORAL_TABLET | Freq: Every day | ORAL | 3 refills | Status: DC

## 2022-09-16 MED ORDER — ESOMEPRAZOLE MAGNESIUM 40 MG PO CPDR: DELAYED_RELEASE_CAPSULE | ORAL | 3 refills | Status: DC

## 2022-09-16 MED ORDER — LISINOPRIL 5 MG PO TABS: 5.0000 mg | ORAL_TABLET | Freq: Two times a day (BID) | ORAL | 3 refills | Status: DC

## 2022-09-16 NOTE — Patient Instructions (Signed)
Continue your medication.  Follow-up in 6 months  Take care  Dr. Torrance Stockley  

## 2022-09-16 NOTE — Assessment & Plan Note (Signed)
Stable on Nexium. Continue. 

## 2022-09-16 NOTE — Assessment & Plan Note (Signed)
Patient wants to continue with diet and lifestyle changes.  Continue metformin and glipizide.

## 2022-09-16 NOTE — Assessment & Plan Note (Signed)
Stable.  Continue statin. 

## 2022-09-16 NOTE — Progress Notes (Signed)
Subjective:  Patient ID: Cristian Nguyen, male    DOB: 1956/06/17  Age: 67 y.o. MRN: [id number]  CC: Chief Complaint  Patient presents with   Diabetes    HPI:  67 year old male with hypertension,, GERD, hypothyroidism, type 2 diabetes, hyperlipidemia presents for follow-up.  Patient's preventative health care is up-to-date excluding his eye exam.  Patient's A1c is improved from prior but is still not quite at goal.  Most recent A1c 7.2.  He is compliant with glipizide and metformin.  Will discuss potential for adding additional medication versus continuing with diet and lifestyle change.  Lipids are fairly well-controlled on atorvastatin.  Most recent LDL 87.  Mildly elevated triglycerides likely due to underlying diabetes.  Hypertension has been stable.  BP elevated today.  However, he has not taken his lisinopril today.  He typically takes it with food or after he eats and he has not eaten yet today.  GERD stable on Nexium.  Patient Active Problem List   Diagnosis Date Noted   Preventative health care 03/18/2022   Hyperlipidemia, mixed 09/16/2021   Post-operative hypothyroidism 06/15/2021   S/P subtotal thyroidectomy 09/01/2018   History of colon polyps    Type 2 diabetes mellitus without complication ([place]) 123XX123   GERD (gastroesophageal reflux disease) 05/08/2013   Essential hypertension, benign 05/08/2013    Social Hx   Social History   Socioeconomic History   Marital status: Married    Spouse name: Not on file   Number of children: Not on file   Years of education: Not on file   Highest education level: Not on file  Occupational History   Not on file  Tobacco Use   Smoking status: Former    Years: 17    Types: Cigarettes    Quit date: 12/09/2006    Years since quitting: 15.7   Smokeless tobacco: Never  Vaping Use   Vaping Use: Never used  Substance and Sexual Activity   Alcohol use: Yes    Comment: occasional   Drug use: Never   Sexual activity:  Not Currently  Other Topics Concern   Not on file  Social History Narrative   Not on file   Social Determinants of Health   Financial Resource Strain: Not on file  Food Insecurity: Not on file  Transportation Needs: Not on file  Physical Activity: Not on file  Stress: Not on file  Social Connections: Not on file    Review of Systems  Constitutional: Negative.   Respiratory: Negative.    Cardiovascular: Negative.    Objective:  BP (!) 150/92   Pulse 78   Temp 97.6 F (36.4 C)   Ht '6\' 2"'$  (1.88 m)   Wt 267 lb (121.1 kg)   SpO2 95%   BMI 34.28 kg/m      09/16/2022   10:18 AM 09/16/2022   10:14 AM 07/13/2022    2:27 PM  BP/Weight  Systolic BP Q000111Q Q000111Q Q000111Q  Diastolic BP 92 95 83  Wt. (Lbs)  267 268  BMI  34.28 kg/m2 34.41 kg/m2    Physical Exam Vitals and nursing note reviewed.  Constitutional:      General: He is not in acute distress.    Appearance: Normal appearance.  HENT:     Head: Normocephalic and atraumatic.  Eyes:     General:        Right eye: No discharge.        Left eye: No discharge.     Conjunctiva/sclera: Conjunctivae  normal.  Cardiovascular:     Rate and Rhythm: Normal rate and regular rhythm.  Pulmonary:     Effort: Pulmonary effort is normal.     Breath sounds: Normal breath sounds. No wheezing, rhonchi or rales.  Neurological:     Mental Status: He is alert.  Psychiatric:        Mood and Affect: Mood normal.        Behavior: Behavior normal.     Lab Results  Component Value Date   WBC 6.3 09/15/2022   HGB 14.2 09/15/2022   HCT 42.6 09/15/2022   PLT 220 09/15/2022   GLUCOSE 158 (H) 09/15/2022   CHOL 154 09/15/2022   TRIG 198 (H) 09/15/2022   HDL 33 (L) 09/15/2022   LDLCALC 87 09/15/2022   ALT 32 09/15/2022   AST 25 09/15/2022   NA 141 09/15/2022   K 4.2 09/15/2022   CL 101 09/15/2022   CREATININE 0.98 09/15/2022   BUN 16 09/15/2022   CO2 25 09/15/2022   TSH 3.370 06/15/2021   PSA 1.14 05/02/2013   HGBA1C 7.2 (H)  09/15/2022     Assessment & Plan:   Problem List Items Addressed This Visit       Cardiovascular and Mediastinum   Essential hypertension, benign    Has been well-controlled.  BP elevated today as he has not yet taken his medication.      Relevant Medications   atorvastatin (LIPITOR) 20 MG tablet   lisinopril (ZESTRIL) 5 MG tablet     Digestive   GERD (gastroesophageal reflux disease)    Stable on Nexium.  Continue.      Relevant Medications   esomeprazole (NEXIUM) 40 MG capsule     Endocrine   Post-operative hypothyroidism    Needs TSH when he follows up.      Relevant Medications   levothyroxine (SYNTHROID) 50 MCG tablet   Type 2 diabetes mellitus without complication ([place])    Patient wants to continue with diet and lifestyle changes.  Continue metformin and glipizide.      Relevant Medications   atorvastatin (LIPITOR) 20 MG tablet   glipiZIDE (GLUCOTROL) 10 MG tablet   lisinopril (ZESTRIL) 5 MG tablet   metFORMIN (GLUCOPHAGE) 1000 MG tablet     Other   Hyperlipidemia, mixed    Stable.  Continue statin.      Relevant Medications   atorvastatin (LIPITOR) 20 MG tablet   lisinopril (ZESTRIL) 5 MG tablet   Other Visit Diagnoses     Postoperative hypothyroidism       Relevant Medications   levothyroxine (SYNTHROID) 50 MCG tablet       Meds ordered this encounter  Medications   atorvastatin (LIPITOR) 20 MG tablet    Sig: Take 1 tablet (20 mg total) by mouth daily.    Dispense:  90 tablet    Refill:  3   esomeprazole (NEXIUM) 40 MG capsule    Sig: TAKE ONE CAPSULE BY MOUTH ONCE DAILY BEFORE BREAKFAST    Dispense:  90 capsule    Refill:  3   glipiZIDE (GLUCOTROL) 10 MG tablet    Sig: Take 1 tablet (10 mg total) by mouth 2 (two) times daily before a meal.    Dispense:  180 tablet    Refill:  3   levothyroxine (SYNTHROID) 50 MCG tablet    Sig: Take 1 tablet (50 mcg total) by mouth daily.    Dispense:  90 tablet    Refill:  3   lisinopril  (  ZESTRIL) 5 MG tablet    Sig: Take 1 tablet (5 mg total) by mouth 2 (two) times daily.    Dispense:  180 tablet    Refill:  3   metFORMIN (GLUCOPHAGE) 1000 MG tablet    Sig: Take 1 tablet (1,000 mg total) by mouth 2 (two) times daily with a meal.    Dispense:  180 tablet    Refill:  3    Follow-up:  Return in about 6 months (around 03/19/2023).  [place]

## 2022-09-16 NOTE — Assessment & Plan Note (Signed)
Has been well-controlled.  BP elevated today as he has not yet taken his medication.

## 2022-09-16 NOTE — Assessment & Plan Note (Signed)
Needs TSH when he follows up.

## 2022-09-17 LAB — CMP14+EGFR
ALT: 32 IU/L (ref 0–44)
AST: 25 IU/L (ref 0–40)
Albumin/Globulin Ratio: 2 (ref 1.2–2.2)
Albumin: 4.4 g/dL (ref 3.9–4.9)
Alkaline Phosphatase: 69 IU/L (ref 44–121)
BUN/Creatinine Ratio: 16 (ref 10–24)
BUN: 16 mg/dL (ref 8–27)
Bilirubin Total: 0.6 mg/dL (ref 0.0–1.2)
CO2: 25 mmol/L (ref 20–29)
Calcium: 9.1 mg/dL (ref 8.6–10.2)
Chloride: 101 mmol/L (ref 96–106)
Creatinine, Ser: 0.98 mg/dL (ref 0.76–1.27)
Globulin, Total: 2.2 g/dL (ref 1.5–4.5)
Glucose: 158 mg/dL — ABNORMAL HIGH (ref 70–99)
Potassium: 4.2 mmol/L (ref 3.5–5.2)
Sodium: 141 mmol/L (ref 134–144)
Total Protein: 6.6 g/dL (ref 6.0–8.5)
eGFR: 86 mL/min/{1.73_m2} (ref >59)

## 2022-09-17 LAB — MICROALBUMIN / CREATININE URINE RATIO
Creatinine, Urine: 183.4 mg/dL
Microalb/Creat Ratio: 7 mg/g creat (ref 0–29)
Microalbumin, Urine: 13.1 ug/mL

## 2022-09-17 LAB — LIPID PANEL
Chol/HDL Ratio: 4.7 ratio (ref 0.0–5.0)
Cholesterol, Total: 154 mg/dL (ref 100–199)
HDL: 33 mg/dL — ABNORMAL LOW (ref >39)
LDL Chol Calc (NIH): 87 mg/dL (ref 0–99)
Triglycerides: 198 mg/dL — ABNORMAL HIGH (ref 0–149)
VLDL Cholesterol Cal: 34 mg/dL (ref 5–40)

## 2022-09-17 LAB — CBC WITH DIFFERENTIAL/PLATELET
Basophils Absolute: 0 10*3/uL (ref 0.0–0.2)
Basos: 0 %
EOS (ABSOLUTE): 0.1 10*3/uL (ref 0.0–0.4)
Eos: 2 %
Hematocrit: 42.6 % (ref 37.5–51.0)
Hemoglobin: 14.2 g/dL (ref 13.0–17.7)
Immature Grans (Abs): 0 10*3/uL (ref 0.0–0.1)
Immature Granulocytes: 0 %
Lymphocytes Absolute: 2.6 10*3/uL (ref 0.7–3.1)
Lymphs: 41 %
MCH: 29.2 pg (ref 26.6–33.0)
MCHC: 33.3 g/dL (ref 31.5–35.7)
MCV: 88 fL (ref 79–97)
Monocytes Absolute: 0.4 10*3/uL (ref 0.1–0.9)
Monocytes: 6 %
Neutrophils Absolute: 3.2 10*3/uL (ref 1.4–7.0)
Neutrophils: 51 %
Platelets: 220 10*3/uL (ref 150–450)
RBC: 4.86 x10E6/uL (ref 4.14–5.80)
RDW: 12.5 % (ref 11.6–15.4)
WBC: 6.3 10*3/uL (ref 3.4–10.8)

## 2022-09-17 LAB — HEMOGLOBIN A1C
Est. average glucose Bld gHb Est-mCnc: 160 mg/dL
Hgb A1c MFr Bld: 7.2 % — ABNORMAL HIGH (ref 4.8–5.6)

## 2023-02-22 LAB — HM DIABETES EYE EXAM

## 2023-03-22 ENCOUNTER — Ambulatory Visit (INDEPENDENT_AMBULATORY_CARE_PROVIDER_SITE_OTHER): Payer: PPO | Admitting: Family Medicine

## 2023-03-22 ENCOUNTER — Telehealth: Payer: Self-pay | Admitting: Family Medicine

## 2023-03-22 VITALS — BP 137/87 | HR 77 | Temp 98.4°F | Ht 74.0 in | Wt 266.2 lb

## 2023-03-22 DIAGNOSIS — I10 Essential (primary) hypertension: Secondary | ICD-10-CM

## 2023-03-22 DIAGNOSIS — Z125 Encounter for screening for malignant neoplasm of prostate: Secondary | ICD-10-CM

## 2023-03-22 DIAGNOSIS — Z7984 Long term (current) use of oral hypoglycemic drugs: Secondary | ICD-10-CM

## 2023-03-22 DIAGNOSIS — Z130 Encounter for screening for diseases of the blood and blood-forming organs and certain disorders involving the immune mechanism: Secondary | ICD-10-CM

## 2023-03-22 DIAGNOSIS — Z1211 Encounter for screening for malignant neoplasm of colon: Secondary | ICD-10-CM

## 2023-03-22 DIAGNOSIS — E782 Mixed hyperlipidemia: Secondary | ICD-10-CM | POA: Diagnosis not present

## 2023-03-22 DIAGNOSIS — E890 Postprocedural hypothyroidism: Secondary | ICD-10-CM | POA: Diagnosis not present

## 2023-03-22 DIAGNOSIS — K219 Gastro-esophageal reflux disease without esophagitis: Secondary | ICD-10-CM

## 2023-03-22 DIAGNOSIS — E119 Type 2 diabetes mellitus without complications: Secondary | ICD-10-CM

## 2023-03-22 MED ORDER — ESOMEPRAZOLE MAGNESIUM 40 MG PO CPDR: DELAYED_RELEASE_CAPSULE | ORAL | 3 refills | Status: DC

## 2023-03-22 MED ORDER — LISINOPRIL 5 MG PO TABS
5.0000 mg | ORAL_TABLET | Freq: Two times a day (BID) | ORAL | 3 refills | Status: DC
Start: 2023-03-22 — End: 2024-01-05

## 2023-03-22 MED ORDER — METFORMIN HCL 1000 MG PO TABS: 1000.0000 mg | ORAL_TABLET | Freq: Two times a day (BID) | ORAL | 3 refills | Status: DC

## 2023-03-22 MED ORDER — GLIPIZIDE 10 MG PO TABS: 10.0000 mg | ORAL_TABLET | Freq: Two times a day (BID) | ORAL | 3 refills | Status: DC

## 2023-03-22 NOTE — Telephone Encounter (Signed)
Patient was seen this morning and would like a referral to [facility]  Dr. Russella Dar in [place].

## 2023-03-22 NOTE — Assessment & Plan Note (Signed)
TSH today to assess.  Continue current dosing.  Will dose adjust based on TSH.

## 2023-03-22 NOTE — Assessment & Plan Note (Signed)
Stable

## 2023-03-22 NOTE — Patient Instructions (Addendum)
Labs ordered.   Referral placed.  Continue your medications.  Follow up in 6 months.

## 2023-03-22 NOTE — Assessment & Plan Note (Signed)
Well-controlled.  Continue lisinopril.

## 2023-03-22 NOTE — Assessment & Plan Note (Signed)
A1c today to assess.  Continue current medications.  May need to add pharmacotherapy if A1c not at goal.

## 2023-03-22 NOTE — Telephone Encounter (Signed)
Tommie Sams, DO     Placing referral.

## 2023-03-22 NOTE — Progress Notes (Signed)
Subjective:  Patient ID: Cristian Nguyen, male    DOB: 10-May-1956  Age: 67 y.o. MRN: [id number]  CC: Follow up   HPI:  67 year old male with hypertension, GERD, type 2 diabetes, hypothyroidism, hyperlipidemia presents for follow-up.  Patient states that he is doing well.  He continues to play golf regularly.  Patient declines vaccines today.  Patient is in need of colonoscopy.  Would like referral.  Denies chest pain or shortness of breath.  Needs labs today.  Last A1c 7.2.  Endorses compliance with metformin and glipizide.  No hypoglycemia.  Last LDL was 87.  He is on Lipitor.  Lipid panel today to assess.  Hypertension is well-controlled on lisinopril.  Hypothyroidism has been stable on current dosing of Synthroid.  Labs today.  Patient Active Problem List   Diagnosis Date Noted   Preventative health care 03/18/2022   Hyperlipidemia, mixed 09/16/2021   Post-operative hypothyroidism 06/15/2021   S/P subtotal thyroidectomy 09/01/2018   History of colon polyps    Type 2 diabetes mellitus without complication (HCC) 04/27/2015   GERD (gastroesophageal reflux disease) 05/08/2013   Essential hypertension, benign 05/08/2013    Social Hx   Social History   Socioeconomic History   Marital status: Married    Spouse name: Not on file   Number of children: Not on file   Years of education: Not on file   Highest education level: Not on file  Occupational History   Not on file  Tobacco Use   Smoking status: Former    Current packs/day: 0.00    Types: Cigarettes    Start date: 12/09/1971    Quit date: 12/09/2006    Years since quitting: 16.2   Smokeless tobacco: Never  Vaping Use   Vaping status: Never Used  Substance and Sexual Activity   Alcohol use: Yes    Comment: occasional   Drug use: Never   Sexual activity: Not Currently  Other Topics Concern   Not on file  Social History Narrative   Not on file   Social Determinants of Health   Financial Resource  Strain: Not on file  Food Insecurity: Not on file  Transportation Needs: Not on file  Physical Activity: Not on file  Stress: Not on file  Social Connections: Not on file    Review of Systems Per HPI  Objective:  BP 137/87   Pulse 77   Temp 98.4 F (36.9 C)   Ht 6\' 2"  (1.88 m)   Wt 266 lb 3.2 oz (120.7 kg)   SpO2 95%   BMI 34.18 kg/m      03/22/2023    8:36 AM 09/16/2022   10:18 AM 09/16/2022   10:14 AM  BP/Weight  Systolic BP 137 150 145  Diastolic BP 87 92 95  Wt. (Lbs) 266.2  267  BMI 34.18 kg/m2  34.28 kg/m2    Physical Exam Vitals and nursing note reviewed.  Constitutional:      General: He is not in acute distress.    Appearance: Normal appearance.  HENT:     Head: Normocephalic and atraumatic.  Eyes:     General:        Right eye: No discharge.        Left eye: No discharge.     Conjunctiva/sclera: Conjunctivae normal.  Cardiovascular:     Rate and Rhythm: Normal rate and regular rhythm.  Pulmonary:     Effort: Pulmonary effort is normal.     Breath sounds: Normal breath sounds.  No wheezing, rhonchi or rales.  Neurological:     Mental Status: He is alert.  Psychiatric:        Mood and Affect: Mood normal.        Behavior: Behavior normal.     Lab Results  Component Value Date   WBC 6.3 09/15/2022   HGB 14.2 09/15/2022   HCT 42.6 09/15/2022   PLT 220 09/15/2022   GLUCOSE 158 (H) 09/15/2022   CHOL 154 09/15/2022   TRIG 198 (H) 09/15/2022   HDL 33 (L) 09/15/2022   LDLCALC 87 09/15/2022   ALT 32 09/15/2022   AST 25 09/15/2022   NA 141 09/15/2022   K 4.2 09/15/2022   CL 101 09/15/2022   CREATININE 0.98 09/15/2022   BUN 16 09/15/2022   CO2 25 09/15/2022   TSH 3.370 06/15/2021   PSA 1.14 05/02/2013   HGBA1C 7.2 (H) 09/15/2022     Assessment & Plan:   Problem List Items Addressed This Visit       Cardiovascular and Mediastinum   Essential hypertension, benign    Well-controlled.  Continue lisinopril.      Relevant Medications    lisinopril (ZESTRIL) 5 MG tablet     Digestive   GERD (gastroesophageal reflux disease)    Stable.      Relevant Medications   esomeprazole (NEXIUM) 40 MG capsule     Endocrine   Type 2 diabetes mellitus without complication (HCC) - Primary    A1c today to assess.  Continue current medications.  May need to add pharmacotherapy if A1c not at goal.      Relevant Medications   glipiZIDE (GLUCOTROL) 10 MG tablet   lisinopril (ZESTRIL) 5 MG tablet   metFORMIN (GLUCOPHAGE) 1000 MG tablet   Other Relevant Orders   CMP14+EGFR   Hemoglobin A1c   Microalbumin / creatinine urine ratio   Post-operative hypothyroidism    TSH today to assess.  Continue current dosing.  Will dose adjust based on TSH.      Relevant Orders   TSH     Other   Hyperlipidemia, mixed    Lipid panel today.  Continue statin.      Relevant Medications   lisinopril (ZESTRIL) 5 MG tablet   Other Relevant Orders   Lipid panel   Other Visit Diagnoses     Encounter for screening colonoscopy       Relevant Orders   Ambulatory referral to Gastroenterology   Screening for deficiency anemia       Relevant Orders   CBC   Screening PSA (prostate specific antigen)       Relevant Orders   PSA       Meds ordered this encounter  Medications   esomeprazole (NEXIUM) 40 MG capsule    Sig: TAKE ONE CAPSULE BY MOUTH ONCE DAILY BEFORE BREAKFAST    Dispense:  90 capsule    Refill:  3   glipiZIDE (GLUCOTROL) 10 MG tablet    Sig: Take 1 tablet (10 mg total) by mouth 2 (two) times daily before a meal.    Dispense:  180 tablet    Refill:  3   lisinopril (ZESTRIL) 5 MG tablet    Sig: Take 1 tablet (5 mg total) by mouth 2 (two) times daily.    Dispense:  180 tablet    Refill:  3   metFORMIN (GLUCOPHAGE) 1000 MG tablet    Sig: Take 1 tablet (1,000 mg total) by mouth 2 (two) times daily with a meal.  Dispense:  180 tablet    Refill:  3    Follow-up:  6 months  Cristian Brule Adriana Simas DO [facility]

## 2023-03-22 NOTE — Assessment & Plan Note (Signed)
Lipid panel today Continue statin

## 2023-03-30 ENCOUNTER — Ambulatory Visit: Payer: PPO | Admitting: Family Medicine

## 2023-03-30 VITALS — BP 132/80 | HR 94 | Temp 97.8°F | Wt 269.2 lb

## 2023-03-30 DIAGNOSIS — Z7984 Long term (current) use of oral hypoglycemic drugs: Secondary | ICD-10-CM

## 2023-03-30 DIAGNOSIS — E119 Type 2 diabetes mellitus without complications: Secondary | ICD-10-CM

## 2023-03-30 NOTE — Progress Notes (Signed)
Subjective:  Patient ID: Cristian Nguyen, male    DOB: 03-Sep-1955  Age: 67 y.o. MRN: [id number]  CC: Discuss diabetes medications   HPI:  67 year old male presents for evaluation of the above.  Patient's A1c recently rose from 7.2- to 9.3.  He reports that he has not been as compliant with his diet.  He states that he is not been as compliant with glipizide as well has states seems to lower his sugar abruptly.  He is here to discuss treatment options today.  Will discuss GLP-1 and SGLT2 medications.  Patient Active Problem List   Diagnosis Date Noted   Preventative health care 03/18/2022   Hyperlipidemia, mixed 09/16/2021   Post-operative hypothyroidism 06/15/2021   S/P subtotal thyroidectomy 09/01/2018   History of colon polyps    Type 2 diabetes mellitus without complication (HCC) 04/27/2015   GERD (gastroesophageal reflux disease) 05/08/2013   Essential hypertension, benign 05/08/2013    Social Hx   Social History   Socioeconomic History   Marital status: Married    Spouse name: Not on file   Number of children: Not on file   Years of education: Not on file   Highest education level: Not on file  Occupational History   Not on file  Tobacco Use   Smoking status: Former    Current packs/day: 0.00    Types: Cigarettes    Start date: 12/09/1971    Quit date: 12/09/2006    Years since quitting: 16.3   Smokeless tobacco: Never  Vaping Use   Vaping status: Never Used  Substance and Sexual Activity   Alcohol use: Yes    Comment: occasional   Drug use: Never   Sexual activity: Not Currently  Other Topics Concern   Not on file  Social History Narrative   Not on file   Social Determinants of Health   Financial Resource Strain: Not on file  Food Insecurity: Not on file  Transportation Needs: Not on file  Physical Activity: Not on file  Stress: Not on file  Social Connections: Not on file    Review of Systems Per HPI  Objective:  BP 132/80   Pulse 94   Temp  97.8 F (36.6 C) (Oral)   Wt 269 lb 3.2 oz (122.1 kg)   SpO2 94%   BMI 34.56 kg/m      03/30/2023    1:14 PM 03/22/2023    8:36 AM 09/16/2022   10:18 AM  BP/Weight  Systolic BP 132 137 150  Diastolic BP 80 87 92  Wt. (Lbs) 269.2 266.2   BMI 34.56 kg/m2 34.18 kg/m2     Physical Exam Vitals reviewed.  Constitutional:      General: He is not in acute distress.    Appearance: Normal appearance.  HENT:     Head: Normocephalic and atraumatic.  Pulmonary:     Effort: Pulmonary effort is normal. No respiratory distress.  Neurological:     Mental Status: He is alert.  Psychiatric:        Mood and Affect: Mood normal.        Behavior: Behavior normal.     Lab Results  Component Value Date   WBC 8.1 03/22/2023   HGB 16.3 03/22/2023   HCT 47.6 03/22/2023   PLT 234 03/22/2023   GLUCOSE 203 (H) 03/22/2023   CHOL 171 03/22/2023   TRIG 294 (H) 03/22/2023   HDL 36 (L) 03/22/2023   LDLCALC 86 03/22/2023   ALT 33 03/22/2023  AST 24 03/22/2023   NA 138 03/22/2023   K 4.6 03/22/2023   CL 97 03/22/2023   CREATININE 1.00 03/22/2023   BUN 12 03/22/2023   CO2 26 03/22/2023   TSH 2.050 03/22/2023   PSA 1.14 05/02/2013   HGBA1C 9.3 (H) 03/22/2023     Assessment & Plan:   Problem List Items Addressed This Visit       Endocrine   Type 2 diabetes mellitus without complication (HCC) - Primary    Recent worsening.  We discussed GLP-1 medications and SGLT2 medications at length.  We also discussed need for regular exercise and dietary changes.  Patient wants to wait on adding a medication at this time and focus on lifestyle changes.  A1c ordered.  A1c to be done after 12/17.  He will follow-up at the end of December.  He will continue metformin.      Relevant Orders   Hemoglobin A1c    Follow-up:  At the end of December.  Everlene Other DO [facility]

## 2023-03-30 NOTE — Patient Instructions (Signed)
A1c after 12/17.  Follow up at the end of December.  Take care  Dr. Adriana Simas

## 2023-03-30 NOTE — Assessment & Plan Note (Addendum)
Recent worsening.  We discussed GLP-1 medications and SGLT2 medications at length.  We also discussed need for regular exercise and dietary changes.  Patient wants to wait on adding a medication at this time and focus on lifestyle changes.  A1c ordered.  A1c to be done after 12/17.  He will follow-up at the end of December.  He will continue metformin.

## 2023-04-21 ENCOUNTER — Telehealth: Payer: Self-pay | Admitting: Gastroenterology

## 2023-04-21 NOTE — Telephone Encounter (Signed)
(  For documentation purposes: Colonoscopy by Dr. Franky Macho of surgery August 2013.  Cecal/proximal ascending colon polyp removed hot snare, not retrieved.  Rectal hyperplastic polyp Surveillance colonoscopy July 2019 Dr. Liston Alba hyperplastic polyp.  Reportedly complete exam, though no photodocumentation, scope advancement and prep quality measures therefore uncertain.)   Based on the available records, this patient should have a surveillance colonoscopy. It could be directly booked with me in the [facility].  If he has any difficulties with chronic constipation, he should certainly make the endoscopy preprocedure nurse aware of that at the time his procedure is booked in case we need any special considerations regarding the bowel prep.  Ellwood Dense, MD

## 2023-04-21 NOTE — Telephone Encounter (Signed)
Good afternoon Dr. Myrtie Neither,   Supervising Provider PM 10/17  We received a call from this patient wishing to schedule a colonoscopy. Patient last had colonoscopy in 01/2018 at [facility] as an outpatient. He has since moved to our area and would like to establish care with [facility]. Records from procedure are in Epic for you to review. Would you please advise on scheduling?  Thank you.

## 2023-04-22 NOTE — Telephone Encounter (Signed)
Called and left voicemail for patient to schedule direct colonoscopy at [facility].

## 2023-06-14 ENCOUNTER — Telehealth: Payer: Self-pay | Admitting: *Deleted

## 2023-06-14 NOTE — Telephone Encounter (Signed)
Reason for CRM: PT is requesting Dr. Adriana Simas to put in his blood work orders and to call PT when they are in so he can go complete his blood work.

## 2023-06-16 ENCOUNTER — Other Ambulatory Visit: Payer: Self-pay

## 2023-06-16 DIAGNOSIS — E119 Type 2 diabetes mellitus without complications: Secondary | ICD-10-CM

## 2023-06-16 NOTE — Telephone Encounter (Signed)
Called pt to inform of lab work being ready, no answer left message

## 2023-06-16 NOTE — Telephone Encounter (Signed)
Everlene Other G, DO     Please order A1c.

## 2023-06-22 ENCOUNTER — Ambulatory Visit: Payer: PPO | Admitting: Family Medicine

## 2023-06-22 VITALS — BP 120/83 | HR 73 | Temp 97.3°F | Ht 74.0 in | Wt 268.0 lb

## 2023-06-22 DIAGNOSIS — Z1211 Encounter for screening for malignant neoplasm of colon: Secondary | ICD-10-CM

## 2023-06-22 DIAGNOSIS — Z7984 Long term (current) use of oral hypoglycemic drugs: Secondary | ICD-10-CM

## 2023-06-22 DIAGNOSIS — E119 Type 2 diabetes mellitus without complications: Secondary | ICD-10-CM | POA: Diagnosis not present

## 2023-06-22 LAB — HEMOGLOBIN A1C
Est. average glucose Bld gHb Est-mCnc: 174 mg/dL
Hgb A1c MFr Bld: 7.7 % — ABNORMAL HIGH (ref 4.8–5.6)

## 2023-06-22 MED ORDER — METFORMIN HCL 1000 MG PO TABS: 1000.0000 mg | ORAL_TABLET | Freq: Two times a day (BID) | ORAL | 3 refills | Status: DC

## 2023-06-22 MED ORDER — GLIPIZIDE 10 MG PO TABS: 10.0000 mg | ORAL_TABLET | Freq: Two times a day (BID) | ORAL | 3 refills | Status: DC

## 2023-06-22 NOTE — Telephone Encounter (Signed)
Patient was seen in the office today , labs were addressed.

## 2023-06-22 NOTE — Patient Instructions (Signed)
Referral placed.  Follow up in 6 months.  Merry Christmas.  Dr. Adriana Simas

## 2023-06-22 NOTE — Assessment & Plan Note (Signed)
Improved but not at goal. Discuss addition of SGLT2. Patient elects to continue with lifestyle changes and reassess in 6 months.

## 2023-06-22 NOTE — Progress Notes (Signed)
Subjective:  Patient ID: Cristian Nguyen, male    DOB: 04-15-1956  Age: 67 y.o. MRN: [id number]  CC:   Chief Complaint  Patient presents with   Diabetes    Lab results    HPI:  67 year old male presents for follow up.  He has been working on lifestyle changes. A1c has improved from 9.3 to 7.7. He remains on Metformin and Glipizide. Has been resistant to adding additional meds.   Needs foot exam today.   Patient Active Problem List   Diagnosis Date Noted   Preventative health care 03/18/2022   Hyperlipidemia, mixed 09/16/2021   Post-operative hypothyroidism 06/15/2021   S/P subtotal thyroidectomy 09/01/2018   History of colon polyps    Type 2 diabetes mellitus without complication (HCC) 04/27/2015   GERD (gastroesophageal reflux disease) 05/08/2013   Essential hypertension, benign 05/08/2013    Social Hx   Social History   Socioeconomic History   Marital status: Married    Spouse name: Not on file   Number of children: Not on file   Years of education: Not on file   Highest education level: Not on file  Occupational History   Not on file  Tobacco Use   Smoking status: Former    Current packs/day: 0.00    Types: Cigarettes    Start date: 12/09/1971    Quit date: 12/09/2006    Years since quitting: 16.5   Smokeless tobacco: Never  Vaping Use   Vaping status: Never Used  Substance and Sexual Activity   Alcohol use: Yes    Comment: occasional   Drug use: Never   Sexual activity: Not Currently  Other Topics Concern   Not on file  Social History Narrative   Not on file   Social Drivers of Health   Financial Resource Strain: Not on file  Food Insecurity: Not on file  Transportation Needs: Not on file  Physical Activity: Not on file  Stress: Not on file  Social Connections: Not on file    Review of Systems  Respiratory: Negative.    Cardiovascular: Negative.    Objective:  BP 120/83   Pulse 73   Temp (!) 97.3 F (36.3 C)   Ht 6\' 2"  (1.88 m)   Wt  268 lb (121.6 kg)   SpO2 96%   BMI 34.41 kg/m      06/22/2023    9:50 AM 06/22/2023    9:49 AM 03/30/2023    1:14 PM  BP/Weight  Systolic BP 120 148 132  Diastolic BP 83 99 80  Wt. (Lbs)  268 269.2  BMI  34.41 kg/m2 34.56 kg/m2    Physical Exam Vitals and nursing note reviewed.  Constitutional:      General: He is not in acute distress.    Appearance: Normal appearance.  HENT:     Head: Normocephalic and atraumatic.  Eyes:     General:        Right eye: No discharge.        Left eye: No discharge.     Conjunctiva/sclera: Conjunctivae normal.  Cardiovascular:     Rate and Rhythm: Normal rate and regular rhythm.  Pulmonary:     Effort: Pulmonary effort is normal.     Breath sounds: Normal breath sounds. No wheezing, rhonchi or rales.  Feet:     Comments: Diabetic Foot Check -  Appearance - no lesions, ulcers or calluses Skin - no unusual pallor or redness Monofilament testing -  Right - Great  toe, medial, central, lateral ball and posterior foot intact Left - Great toe, medial, central, lateral ball and posterior foot intact  Neurological:     Mental Status: He is alert.  Psychiatric:        Mood and Affect: Mood normal.        Behavior: Behavior normal.    Lab Results  Component Value Date   WBC 8.1 03/22/2023   HGB 16.3 03/22/2023   HCT 47.6 03/22/2023   PLT 234 03/22/2023   GLUCOSE 203 (H) 03/22/2023   CHOL 171 03/22/2023   TRIG 294 (H) 03/22/2023   HDL 36 (L) 03/22/2023   LDLCALC 86 03/22/2023   ALT 33 03/22/2023   AST 24 03/22/2023   NA 138 03/22/2023   K 4.6 03/22/2023   CL 97 03/22/2023   CREATININE 1.00 03/22/2023   BUN 12 03/22/2023   CO2 26 03/22/2023   TSH 2.050 03/22/2023   PSA 1.14 05/02/2013   HGBA1C 7.7 (H) 06/21/2023     Assessment & Plan:   Problem List Items Addressed This Visit       Endocrine   Type 2 diabetes mellitus without complication (HCC) - Primary   Improved but not at goal. Discuss addition of SGLT2. Patient  elects to continue with lifestyle changes and reassess in 6 months.       Relevant Medications   glipiZIDE (GLUCOTROL) 10 MG tablet   metFORMIN (GLUCOPHAGE) 1000 MG tablet   Other Visit Diagnoses       Encounter for screening colonoscopy       Relevant Orders   Ambulatory referral to Gastroenterology       Meds ordered this encounter  Medications   glipiZIDE (GLUCOTROL) 10 MG tablet    Sig: Take 1 tablet (10 mg total) by mouth 2 (two) times daily before a meal.    Dispense:  180 tablet    Refill:  3   metFORMIN (GLUCOPHAGE) 1000 MG tablet    Sig: Take 1 tablet (1,000 mg total) by mouth 2 (two) times daily with a meal.    Dispense:  180 tablet    Refill:  3    Follow-up:  6 months  Jojo Pehl Adriana Simas DO [facility]

## 2023-06-30 ENCOUNTER — Encounter: Payer: Self-pay | Admitting: Gastroenterology

## 2023-07-13 ENCOUNTER — Encounter (HOSPITAL_COMMUNITY): Payer: Self-pay

## 2023-07-13 ENCOUNTER — Other Ambulatory Visit: Payer: Self-pay

## 2023-07-13 ENCOUNTER — Emergency Department (HOSPITAL_COMMUNITY)
Admission: EM | Admit: 2023-07-13 | Discharge: 2023-07-14 | Disposition: A | Discharge status: Home / Self Care | Payer: PPO | Attending: Emergency Medicine | Admitting: Emergency Medicine

## 2023-07-13 DIAGNOSIS — R030 Elevated blood-pressure reading, without diagnosis of hypertension: Secondary | ICD-10-CM | POA: Diagnosis not present

## 2023-07-13 DIAGNOSIS — M62830 Muscle spasm of back: Secondary | ICD-10-CM | POA: Diagnosis not present

## 2023-07-13 DIAGNOSIS — Y92410 Unspecified street and highway as the place of occurrence of the external cause: Secondary | ICD-10-CM | POA: Diagnosis not present

## 2023-07-13 DIAGNOSIS — M9903 Segmental and somatic dysfunction of lumbar region: Secondary | ICD-10-CM | POA: Diagnosis not present

## 2023-07-13 DIAGNOSIS — M9902 Segmental and somatic dysfunction of thoracic region: Secondary | ICD-10-CM | POA: Diagnosis not present

## 2023-07-13 DIAGNOSIS — M9905 Segmental and somatic dysfunction of pelvic region: Secondary | ICD-10-CM | POA: Diagnosis not present

## 2023-07-13 DIAGNOSIS — E119 Type 2 diabetes mellitus without complications: Secondary | ICD-10-CM | POA: Insufficient documentation

## 2023-07-13 DIAGNOSIS — Z79899 Other long term (current) drug therapy: Secondary | ICD-10-CM | POA: Diagnosis not present

## 2023-07-13 DIAGNOSIS — Z7984 Long term (current) use of oral hypoglycemic drugs: Secondary | ICD-10-CM | POA: Diagnosis not present

## 2023-07-13 DIAGNOSIS — I10 Essential (primary) hypertension: Secondary | ICD-10-CM | POA: Insufficient documentation

## 2023-07-13 DIAGNOSIS — M546 Pain in thoracic spine: Secondary | ICD-10-CM | POA: Diagnosis not present
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
## 2023-07-14 NOTE — Discharge Instructions (Signed)
 Return to the emergency department for blood pressures persistently over 200 systolic, or if you experience any headache, chest pain, difficulty breathing, abdominal pain, or other new and concerning symptoms.

## 2023-08-02 ENCOUNTER — Ambulatory Visit (AMBULATORY_SURGERY_CENTER): Payer: PPO | Admitting: *Deleted

## 2023-08-02 ENCOUNTER — Encounter: Payer: Self-pay | Admitting: Gastroenterology

## 2023-08-02 VITALS — Ht 74.0 in | Wt 260.0 lb

## 2023-08-02 DIAGNOSIS — Z860100 Personal history of colon polyps, unspecified: Secondary | ICD-10-CM

## 2023-08-02 MED ORDER — SUFLAVE 178.7 G PO SOLR
1.0000 | ORAL | 0 refills | Status: DC
Start: 2023-08-02 — End: 2023-08-16

## 2023-08-02 NOTE — Progress Notes (Signed)
Pt's name and DOB verified at the beginning of the pre-visit wit 2 identifiers  Pt denies any difficulty with ambulating,sitting, laying down or rolling side to side  Pt has no issues with ambulation   Pt has no issues moving head neck or swallowing  No egg or soy allergy known to patient   No issues known to pt with past sedation with any surgeries or procedures  Pt denies having issues being intubated  No FH of Malignant Hyperthermia  Pt is not on diet pills or shots  Pt is not on home 02   Pt is not on blood thinners   Pt denies issues with constipation   Pt is not on dialysis  Pt denise any abnormal heart rhythms   Pt denies any upcoming cardiac testing  Pt encouraged to use to use Singlecare or Goodrx to reduce cost   Patient's chart reviewed by Cathlyn Parsons CNRA prior to pre-visit and patient appropriate for the [facility].  Pre-visit completed and red dot placed by patient's name on their procedure day (on provider's schedule).  .  Visit by phone  Pt states weight is 260 lb  Instructed pt why it is important to and  to call if they have any changes in health or new medications. Directed them to the # given and on instructions.     Instructions reviewed. Pt given both [facility] main # and MD on call # prior to instructions.  Pt states understanding. Instructed to review again prior to procedure. Pt states they will.   Informed pt that they will receive a call from [employer] [facility] [employer] regarding there prep med.

## 2023-08-16 ENCOUNTER — Ambulatory Visit: Payer: HMO | Admitting: Gastroenterology

## 2023-08-16 ENCOUNTER — Encounter: Payer: Self-pay | Admitting: Gastroenterology

## 2023-08-16 VITALS — BP 112/76 | HR 72 | Temp 98.2°F | Resp 13 | Ht 74.0 in | Wt 260.0 lb

## 2023-08-16 DIAGNOSIS — Z860100 Personal history of colon polyps, unspecified: Secondary | ICD-10-CM

## 2023-08-16 DIAGNOSIS — D122 Benign neoplasm of ascending colon: Secondary | ICD-10-CM | POA: Diagnosis not present

## 2023-08-16 DIAGNOSIS — K648 Other hemorrhoids: Secondary | ICD-10-CM

## 2023-08-16 DIAGNOSIS — Z1211 Encounter for screening for malignant neoplasm of colon: Secondary | ICD-10-CM | POA: Diagnosis not present

## 2023-08-16 DIAGNOSIS — D124 Benign neoplasm of descending colon: Secondary | ICD-10-CM

## 2023-08-16 DIAGNOSIS — E785 Hyperlipidemia, unspecified: Secondary | ICD-10-CM | POA: Diagnosis not present

## 2023-08-16 DIAGNOSIS — K5730 Diverticulosis of large intestine without perforation or abscess without bleeding: Secondary | ICD-10-CM

## 2023-08-16 DIAGNOSIS — J449 Chronic obstructive pulmonary disease, unspecified: Secondary | ICD-10-CM | POA: Diagnosis not present

## 2023-08-16 DIAGNOSIS — E119 Type 2 diabetes mellitus without complications: Secondary | ICD-10-CM | POA: Diagnosis not present

## 2023-08-16 DIAGNOSIS — I10 Essential (primary) hypertension: Secondary | ICD-10-CM | POA: Diagnosis not present

## 2023-08-16 MED ORDER — SODIUM CHLORIDE 0.9 % IV SOLN
500.0000 mL | Freq: Once | INTRAVENOUS | Status: DC
Start: 2023-08-16 — End: 2023-08-16

## 2023-08-16 NOTE — Progress Notes (Signed)
Pt's states no medical or surgical changes since previsit or office visit.

## 2023-08-16 NOTE — Op Note (Signed)
[facility] Patient Name: Cristian Nguyen Procedure Date: 08/16/2023 9:39 AM MRN: [id number] Endoscopist: Sherilyn Cooter L. Myrtie Neither , MD, [id number] Age: 68 Referring MD:  Date of Birth: Aug 29, 1955 Gender: Male Account #: [id number] Procedure:                Colonoscopy Indications:              Increased risk colon polyp surveillance: Personal                            history of colonic polyps                           July 2019 rectal HP                           Aug 2013 cecal polyp "fulgurated" and not                            retrieved, add'l rectal HP                           Both exams by Dr Lovell Sheehan in [place] and for Hx                            colon polyps. No colonoscopy reports prior to 2013                            available Medicines:                Monitored Anesthesia Care Procedure:                Pre-Anesthesia Assessment:                           - Prior to the procedure, a History and Physical                            was performed, and patient medications and                            allergies were reviewed. The patient's tolerance of                            previous anesthesia was also reviewed. The risks                            and benefits of the procedure and the sedation                            options and risks were discussed with the patient.                            All questions were answered, and informed consent  was obtained. Prior Anticoagulants: The patient has                            taken no anticoagulant or antiplatelet agents. ASA                            Grade Assessment: III - A patient with severe                            systemic disease. After reviewing the risks and                            benefits, the patient was deemed in satisfactory                            condition to undergo the procedure.                           After obtaining informed consent, the colonoscope                             was passed under direct vision. Throughout the                            procedure, the patient's blood pressure, pulse, and                            oxygen saturations were monitored continuously. The                            Olympus Scope SN [id number] was introduced through the                            anus and advanced to the the terminal ileum, with                            identification of the appendiceal orifice and IC                            valve. The colonoscopy was performed without                            difficulty. The patient tolerated the procedure                            well. The quality of the bowel preparation was                            good. The terminal ileum, ileocecal valve,                            appendiceal orifice, and rectum were photographed.  The bowel preparation used was SuFlav via split                            dose instruction. Scope In: 9:50:14 AM Scope Out: 10:11:37 AM Scope Withdrawal Time: 0 hours 18 minutes 8 seconds  Total Procedure Duration: 0 hours 21 minutes 23 seconds  Findings:                 The perianal and digital rectal examinations were                            normal.                           The terminal ileum appeared normal.                           Repeat examination of right colon under NBI                            performed.                           Two sessile and semi-sessile polyps were found in                            the distal ascending colon. The polyps were 6 to 8                            mm in size. These polyps were removed with a cold                            snare. Resection and retrieval were complete.                           Two sessile polyps were found in the proximal                            descending colon and distal ascending colon. The                            polyps were 3 to 10 mm in size. These polyps were                             removed with a cold snare. Resection and retrieval                            were complete.                           Diverticula were found in the left colon.                           Internal hemorrhoids were found.  The exam was otherwise without abnormality on                            direct and retroflexion views. Complications:            No immediate complications. Estimated Blood Loss:     Estimated blood loss was minimal. Impression:               - The examined portion of the ileum was normal.                           - Two 6 to 8 mm polyps in the distal ascending                            colon, removed with a cold snare. Resected and                            retrieved.                           - Two 3 to 10 mm polyps in the proximal descending                            colon and in the distal ascending colon, removed                            with a cold snare. Resected and retrieved.                           - Diverticulosis in the left colon.                           - Internal hemorrhoids.                           - The examination was otherwise normal on direct                            and retroflexion views. Recommendation:           - Patient has a contact number available for                            emergencies. The signs and symptoms of potential                            delayed complications were discussed with the                            patient. Return to normal activities tomorrow.                            Written discharge instructions were provided to the  patient.                           - Resume previous diet.                           - Continue present medications.                           - Await pathology results.                           - Repeat colonoscopy is recommended for                            surveillance. The colonoscopy date will be                             determined after pathology results from today's                            exam become available for review. Carrel Leather L. Myrtie Neither, MD 08/16/2023 10:21:27 AM This report has been signed electronically.

## 2023-08-16 NOTE — Progress Notes (Signed)
Called to room to assist during endoscopic procedure.  Patient ID and intended procedure confirmed with present staff. Received instructions for my participation in the procedure from the performing physician.

## 2023-08-16 NOTE — Progress Notes (Signed)
Vss nad trans to pacu

## 2023-08-16 NOTE — Progress Notes (Signed)
History and Physical:  This patient presents for endoscopic testing for: Encounter Diagnosis  Name Primary?   History of colonic polyps Yes    July 2019 colonoscopy Lovell Sheehan at [facility]) - rectal HP Auf 2013 (same provider) cecal polyp "fulgurated", and rectal HP Both exams reportedly done for Hx colon polyps, no reports prior to 2013 on file  Patient denies chronic abdominal pain, rectal bleeding, constipation or diarrhea.   Patient is otherwise without complaints or active issues today.   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   Left Sideburn (Cx3,5FU)   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   Left Cheek (Cx3,5FU)   Nodular basal cell carcinoma (BCC) 12/14/2018   Right Deltoid Post (tx p bx)   OA (osteoarthritis)    Thyroid adenoma, toxic    right side   Type 2 diabetes mellitus (HCC)    followed by pcp   Wears contact lenses      Past Surgical History: Past Surgical History:  Procedure Laterality Date   COLONOSCOPY  02/29/2012   Procedure: COLONOSCOPY;  Surgeon: Dalia Heading, MD;  Location: [facility];  Service: Gastroenterology;  Laterality: N/A;   COLONOSCOPY N/A 01/03/2018   Procedure: COLONOSCOPY;  Surgeon: Franky Macho, MD;  Location: [facility];  Service: Gastroenterology;  Laterality: N/A;   COLONOSCOPY     KNEE ARTHROSCOPY Right 04-25-2009  dr Romeo Apple @[facility]     LAPAROSCOPIC CHOLECYSTECTOMY  11-07-2004   dr Lovell Sheehan @[facility]    THYROID LOBECTOMY Right 08/15/2018   Procedure: RIGHT THYROID LOBECTOMY;  Surgeon: Darnell Level, MD;  Location: [facility];  Service: General;  Laterality: Right;    Allergies: No Known Allergies  Outpatient Meds: Current Outpatient Medications  Medication Sig Dispense Refill   ACCU-CHEK GUIDE test strip USE 1 STRIP TO CHECK GLUCOSE ONCE DAILY 50 each 3   atorvastatin (LIPITOR) 20 MG tablet Take 1 tablet (20 mg total) by mouth daily. 90 tablet 3   esomeprazole (NEXIUM) 40 MG capsule TAKE ONE CAPSULE BY MOUTH ONCE DAILY BEFORE BREAKFAST 90 capsule 3   glipiZIDE (GLUCOTROL) 10 MG tablet Take 1 tablet (10 mg total) by mouth 2 (two) times daily before a meal. 180 tablet 3   levothyroxine (SYNTHROID) 50 MCG tablet Take 1 tablet (50 mcg total) by mouth daily. 90 tablet 3   lisinopril (ZESTRIL) 5 MG tablet Take 1 tablet (5 mg total) by mouth 2 (two) times daily. 180 tablet 3   metFORMIN (GLUCOPHAGE) 1000 MG tablet Take 1 tablet (1,000 mg total) by mouth 2 (two) times daily with a meal. 180 tablet 3   Current Facility-Administered Medications  Medication Dose Route Frequency Provider Last Rate Last Admin   0.9 %  sodium chloride infusion  500 mL Intravenous Once Sherrilyn Rist, MD          ___________________________________________________________________ Objective   Exam:  BP 139/81   Pulse 70   Temp 98.2 F (36.8 C) (Temporal)   Ht 6\' 2"  (1.88 m)   Wt 260 lb (117.9 kg)   SpO2 96%   BMI 33.38 kg/m   CV: regular , S1/S2 Resp: clear to auscultation bilaterally, normal RR and effort noted GI: soft, no tenderness, with active bowel sounds.   Assessment: Encounter Diagnosis  Name Primary?   History of colonic polyps Yes     Plan: Colonoscopy   The benefits and risks of the planned procedure were described in detail with the patient or (when appropriate) their health care proxy.  Risks were outlined as  including, but not limited to, bleeding, infection, perforation, adverse medication reaction leading to cardiac or pulmonary decompensation, pancreatitis (if ERCP).  The limitation of incomplete mucosal visualization was also discussed.  No guarantees or warranties were given.  The patient is appropriate for an endoscopic procedure in the ambulatory setting.   - Amada Jupiter, MD

## 2023-08-16 NOTE — Patient Instructions (Signed)

## 2023-08-17 ENCOUNTER — Telehealth: Payer: Self-pay | Admitting: *Deleted

## 2023-08-17 NOTE — Telephone Encounter (Signed)
  Follow up Call-     08/16/2023    8:51 AM  Call back number  Post procedure Call Back phone  # [phone]  Permission to leave phone message Yes     Patient questions:  Do you have a fever, pain , or abdominal swelling? No. Pain Score  0 *  Have you tolerated food without any problems? Yes.    Have you been able to return to your normal activities? Yes.    Do you have any questions about your discharge instructions: Diet   No. Medications  No. Follow up visit  No.  Do you have questions or concerns about your Care? No.  Actions: * If pain score is 4 or above: No action needed, pain <4.

## 2023-08-18 LAB — SURGICAL PATHOLOGY

## 2023-08-19 ENCOUNTER — Encounter: Payer: Self-pay | Admitting: Gastroenterology

## 2023-09-01 ENCOUNTER — Telehealth: Payer: Self-pay

## 2023-09-01 ENCOUNTER — Other Ambulatory Visit: Payer: Self-pay

## 2023-09-01 DIAGNOSIS — G4733 Obstructive sleep apnea (adult) (pediatric): Secondary | ICD-10-CM

## 2023-09-01 NOTE — Telephone Encounter (Signed)
 Communication  Did the patient discuss referral with their provider in the last year? Yes    Discussed between provider and wife        Appointment offered? Yes        Type of order/referral and detailed reason for visit: Referral to [facility]        Preference of office, provider, location: [address], [place], [place] [address]  [address]    [phone]        If referral order, have you been seen by this specialty before? No            Can we respond through MyChart? No

## 2023-09-19 ENCOUNTER — Ambulatory Visit: Payer: PPO | Admitting: Family Medicine

## 2023-09-19 NOTE — Telephone Encounter (Signed)
 He had a colonoscopy 08/16/23 , please advise  Copied from CRM [id number]. Topic: Clinical - Lab/Test Results >> Sep 19, 2023  8:22 AM Cristian Nguyen wrote: Reason for CRM: Patient would like a call regarding his Endoscopy from Last month - he hasn't received a call regarding the results.

## 2023-10-02 ENCOUNTER — Other Ambulatory Visit: Payer: Self-pay | Admitting: Family Medicine

## 2023-10-02 DIAGNOSIS — E890 Postprocedural hypothyroidism: Secondary | ICD-10-CM

## 2023-10-05 DIAGNOSIS — M546 Pain in thoracic spine: Secondary | ICD-10-CM | POA: Diagnosis not present

## 2023-10-05 DIAGNOSIS — M9905 Segmental and somatic dysfunction of pelvic region: Secondary | ICD-10-CM | POA: Diagnosis not present

## 2023-10-05 DIAGNOSIS — M9902 Segmental and somatic dysfunction of thoracic region: Secondary | ICD-10-CM | POA: Diagnosis not present

## 2023-10-05 DIAGNOSIS — M62830 Muscle spasm of back: Secondary | ICD-10-CM | POA: Diagnosis not present

## 2023-10-05 DIAGNOSIS — M9903 Segmental and somatic dysfunction of lumbar region: Secondary | ICD-10-CM | POA: Diagnosis not present

## 2023-11-02 IMAGING — CR DG CHEST 2V
2 series · 2 of 2 positions shown · non-contrast
Comparison: Chest x-ray dated August 14, 2018.

CLINICAL DATA: Cough for the past 2 months.

EXAM:
CHEST - 2 VIEW

[w pa chest]
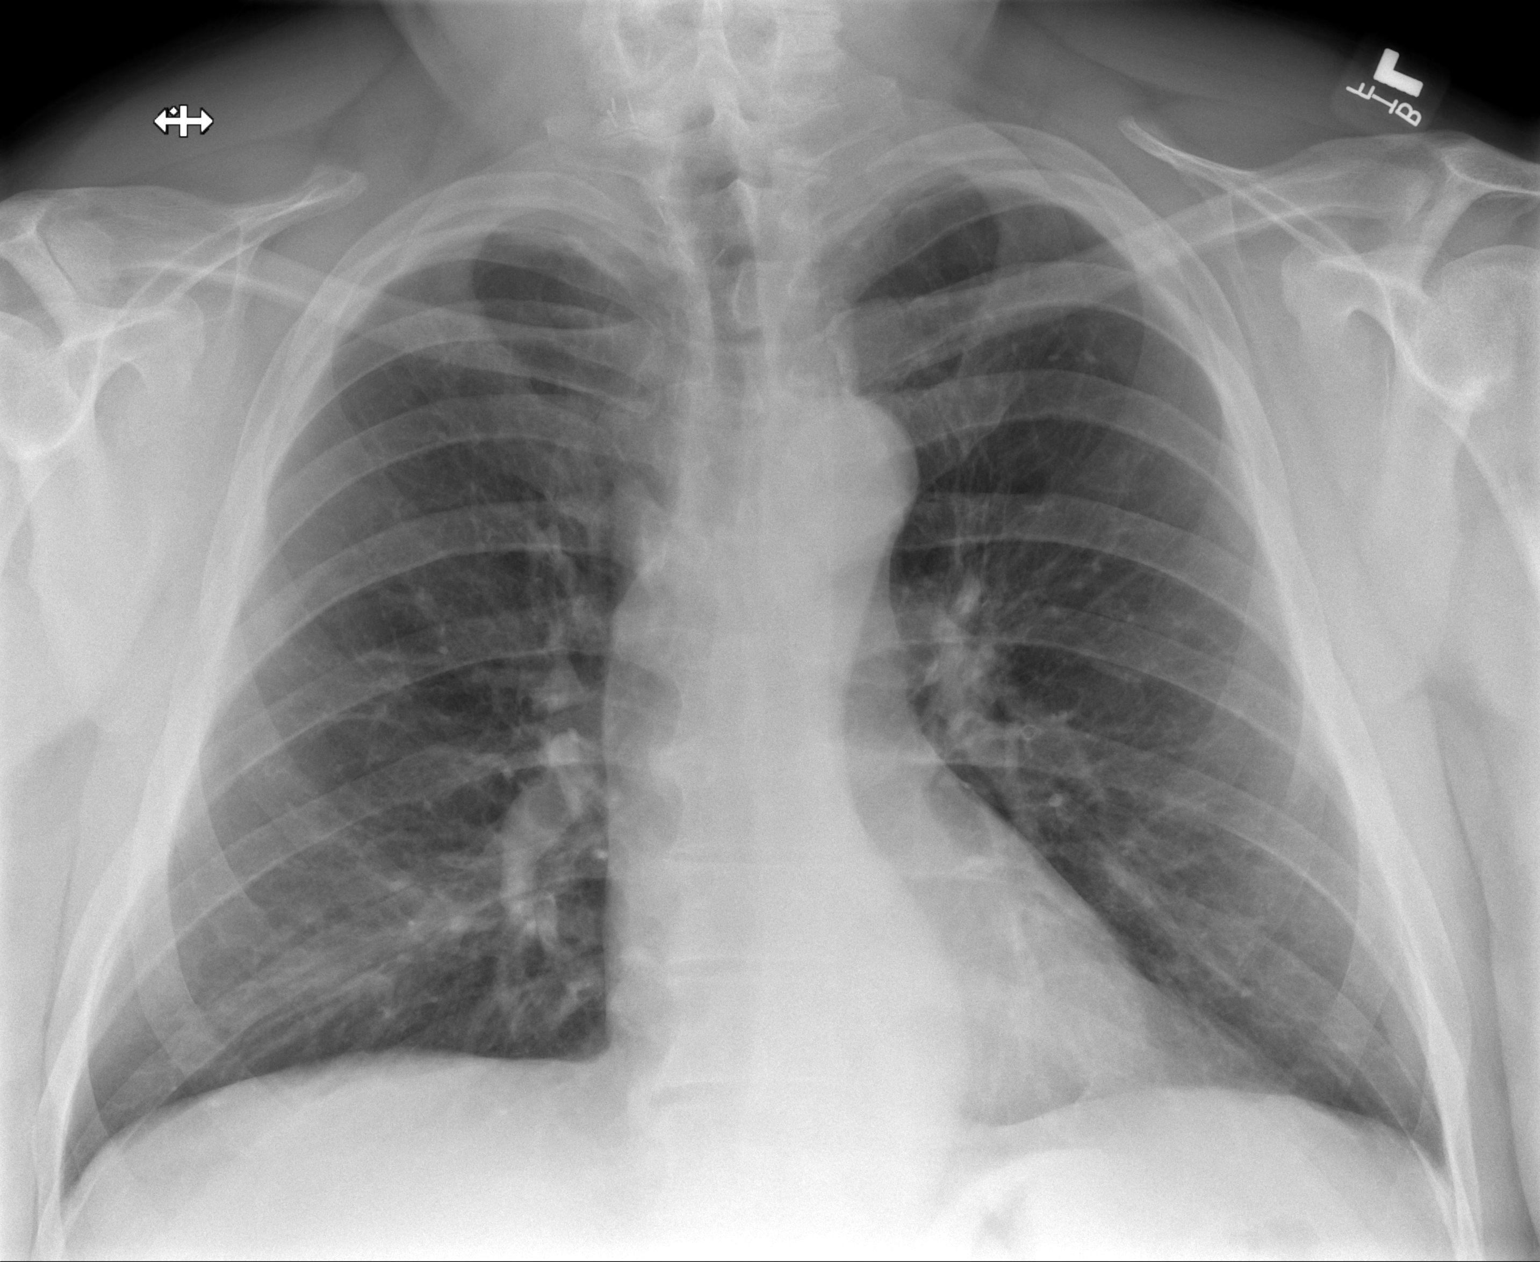

[w chest lat]
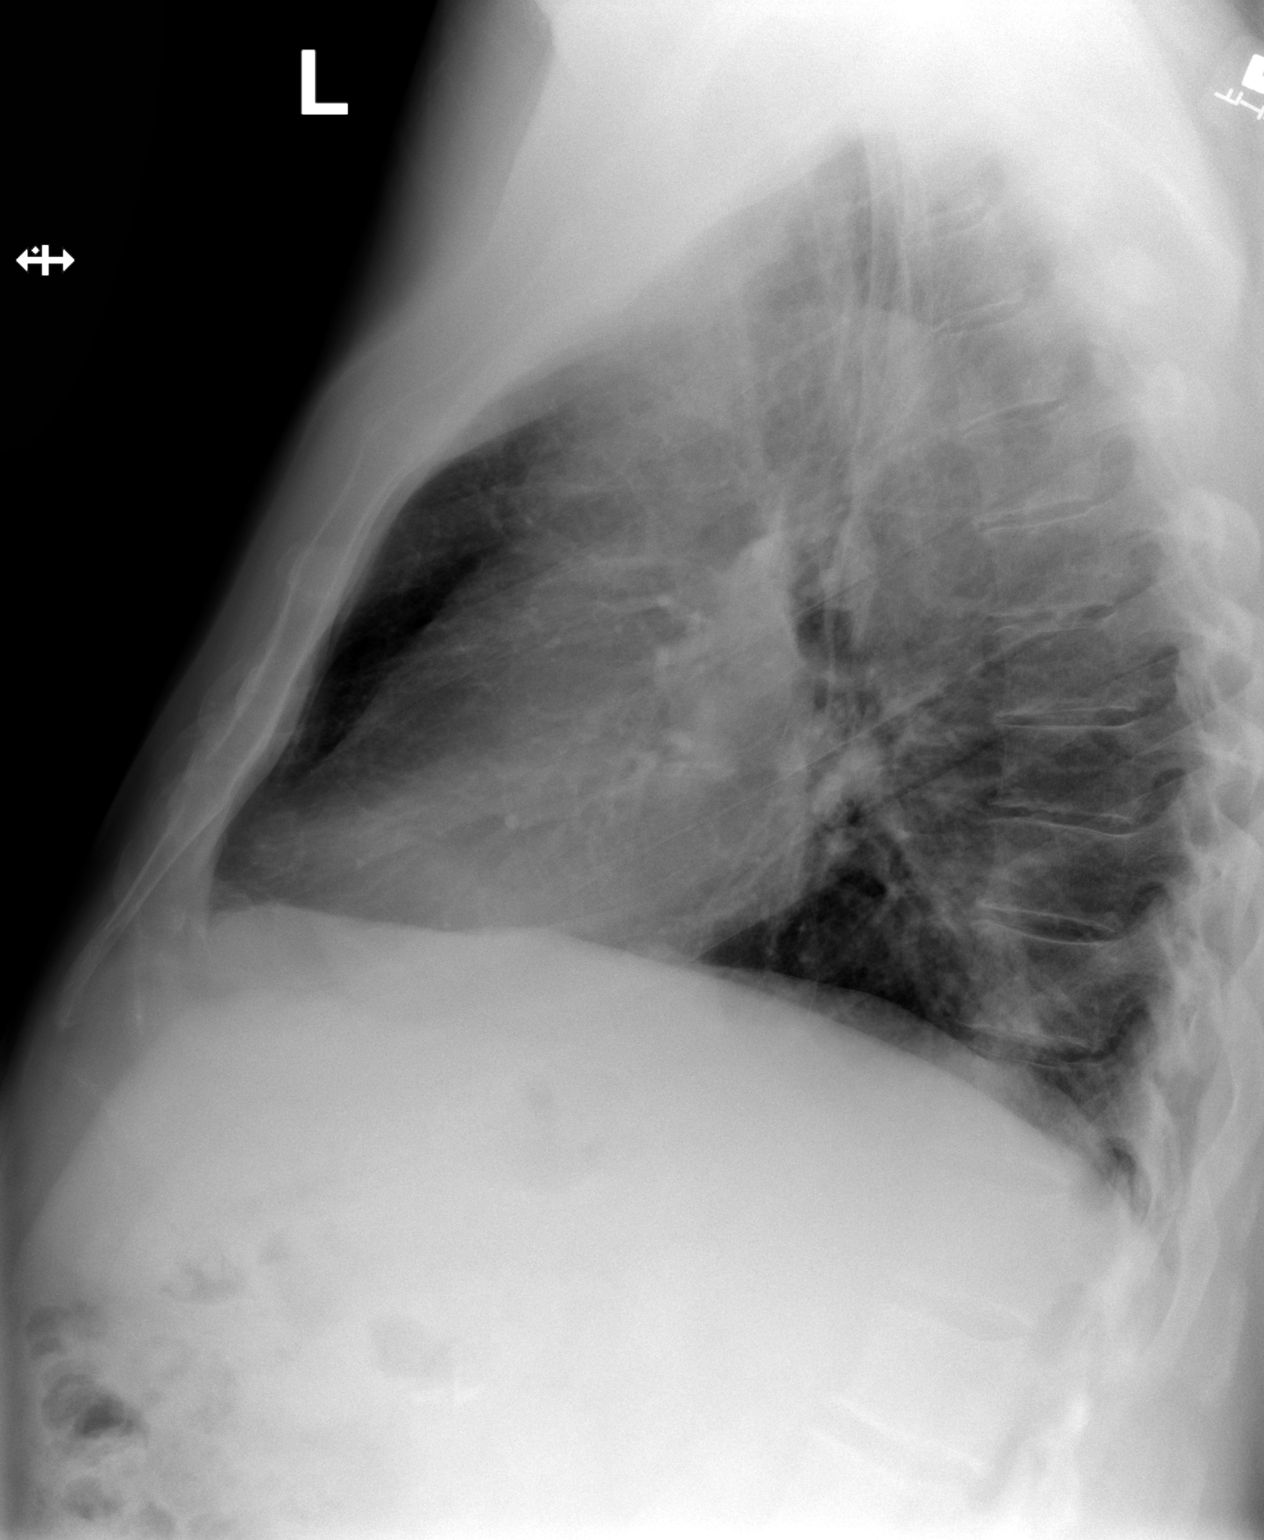

[2 of 2 positions shown; findings below may reference images not displayed]

FINDINGS: None
IMPRESSION: No active cardiopulmonary disease.

## 2023-12-09 ENCOUNTER — Telehealth: Payer: Self-pay

## 2023-12-09 ENCOUNTER — Ambulatory Visit

## 2023-12-09 VITALS — Ht 74.0 in | Wt 260.0 lb

## 2023-12-09 DIAGNOSIS — Z0000 Encounter for general adult medical examination without abnormal findings: Secondary | ICD-10-CM

## 2023-12-09 NOTE — Patient Instructions (Signed)
 [name] , Thank you for taking time out of your busy schedule to complete your Annual Wellness Visit with me. I enjoyed our conversation and look forward to speaking with you again next year. I, as well as your care team,  appreciate your ongoing commitment to your health goals. Please review the following plan we discussed and let me know if I can assist you in the future. Your Game plan/ To Do List     Follow up Visits: Next Medicare AWV with our clinical staff: In 1 year    Have you seen your provider in the last 6 months (3 months if uncontrolled diabetes)? Yes Next Office Visit with your provider: 01/05/24 @ 9:00  Clinician Recommendations:  Aim for 30 minutes of exercise or brisk walking, 6-8 glasses of water , and 5 servings of fruits and vegetables each day.       This is a list of the screening recommended for you and due dates:  Health Maintenance  Topic Date Due   Pneumonia Vaccine (2 of 2 - PCV) 03/21/2024*   Zoster (Shingles) Vaccine (2 of 2) 06/06/2024*   Hemoglobin A1C  12/20/2023   Flu Shot  02/03/2024   Eye exam for diabetics  02/22/2024   Yearly kidney function blood test for diabetes  03/21/2024   Yearly kidney health urinalysis for diabetes  03/21/2024   Complete foot exam   06/21/2024   Medicare Annual Wellness Visit  12/08/2024   Colon Cancer Screening  08/15/2026   DTaP/Tdap/Td vaccine (2 - Td or Tdap) 05/28/2029   HPV Vaccine  Aged Out   Meningitis B Vaccine  Aged Out   COVID-19 Vaccine  Discontinued   Hepatitis C Screening  Discontinued  *Topic was postponed. The date shown is not the original due date.    Advanced directives: (ACP Link)Information on Advanced Care Planning can be found at [place]  Secretary of [facility] Advance Health Care Directives Advance Health Care Directives. [email or web address]   Advance Care Planning is important because it:  [x]  Makes sure you receive the medical care that is consistent with your values, goals, and preferences  [x]   It provides guidance to your family and loved ones and reduces their decisional burden about whether or not they are making the right decisions based on your wishes.  Follow the link provided in your after visit summary or read over the paperwork we have mailed to you to help you started getting your Advance Directives in place. If you need assistance in completing these, please reach out to us  so that we can help you!  See attachments for Preventive Care and Fall Prevention Tips.

## 2023-12-09 NOTE — Progress Notes (Signed)
 Subjective:   Cristian Nguyen is a 68 y.o. who presents for a Medicare Wellness preventive visit.  As a reminder, Annual Wellness Visits don't include a physical exam, and some assessments may be limited, especially if this visit is performed virtually. We may recommend an in-person follow-up visit with your provider if needed.  Visit Complete: Virtual I connected with  Cristian Nguyen on 12/09/23 by a audio enabled telemedicine application and verified that I am speaking with the correct person using two identifiers.  Patient Location: Home  Provider Location: Home Office  I discussed the limitations of evaluation and management by telemedicine. The patient expressed understanding and agreed to proceed.  Vital Signs: Because this visit was a virtual/telehealth visit, some criteria may be missing or patient reported. Any vitals not documented were not able to be obtained and vitals that have been documented are patient reported.  VideoDeclined- This patient declined Librarian, academic. Therefore the visit was completed with audio only.  Persons Participating in Visit: Patient.  AWV Questionnaire: No: Patient Medicare AWV questionnaire was not completed prior to this visit.  Cardiac Risk Factors include: advanced age (>5men, >71 women);diabetes mellitus;dyslipidemia;hypertension;male gender     Objective:     Today's Vitals   12/09/23 1435  Weight: 260 lb (117.9 kg)  Height: 6\' 2"  (1.88 m)   Body mass index is 33.38 kg/m.     12/09/2023    2:37 PM 07/13/2023    7:06 PM 08/15/2018   10:37 AM 08/14/2018    1:56 PM 01/03/2018    6:40 AM 02/29/2012    9:57 AM  Advanced Directives  Does Patient Have a Medical Advance Directive? No No Yes Yes Yes Patient does not have advance directive  Type of Advance Directive   Living will Healthcare Power of [place];Living will Healthcare Power of [place];Living will   Does patient want to make changes to medical  advance directive?   No - Patient declined     Copy of Healthcare Power of Attorney in Chart?    No - copy requested No - copy requested   Would patient like information on creating a medical advance directive? Yes (MAU/Ambulatory/Procedural Areas - Information given) No - Patient declined      Pre-existing out of facility DNR order (yellow form or pink MOST form)      No    Current Medications (verified) Outpatient Encounter Medications as of 12/09/2023  Medication Sig   ACCU-CHEK GUIDE test strip USE 1 STRIP TO CHECK GLUCOSE ONCE DAILY   atorvastatin  (LIPITOR) 20 MG tablet Take 1 tablet by mouth once daily   esomeprazole  (NEXIUM ) 40 MG capsule TAKE ONE CAPSULE BY MOUTH ONCE DAILY BEFORE BREAKFAST   glipiZIDE  (GLUCOTROL ) 10 MG tablet Take 1 tablet (10 mg total) by mouth 2 (two) times daily before a meal.   levothyroxine  (SYNTHROID ) 50 MCG tablet Take 1 tablet by mouth once daily   lisinopril  (ZESTRIL ) 5 MG tablet Take 1 tablet (5 mg total) by mouth 2 (two) times daily.   metFORMIN  (GLUCOPHAGE ) 1000 MG tablet Take 1 tablet (1,000 mg total) by mouth 2 (two) times daily with a meal.   No facility-administered encounter medications on file as of 12/09/2023.    Allergies (verified) Patient has no known allergies.   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   Left Sideburn (Cx3,5FU)   Nodular basal cell carcinoma (BCC)  07/31/2018   Left Cheek (Cx3,5FU)   Nodular basal cell carcinoma (BCC) 12/14/2018   Right Deltoid Post (tx p bx)   OA (osteoarthritis)    Thyroid  adenoma, toxic    right side   Type 2 diabetes mellitus (HCC)    followed by pcp   Wears contact lenses    Past Surgical History:  Procedure Laterality Date   COLONOSCOPY  02/29/2012   Procedure: COLONOSCOPY;  Surgeon: Beau Bound, MD;  Location: [facility];  Service: Gastroenterology;  Laterality: N/A;   COLONOSCOPY N/A 01/03/2018   Procedure: COLONOSCOPY;  Surgeon: Alanda Allegra, MD;  Location: [facility];  Service: Gastroenterology;  Laterality: N/A;   COLONOSCOPY     KNEE ARTHROSCOPY Right 04-25-2009  dr Phyllis Breeze @[facility]    LAPAROSCOPIC CHOLECYSTECTOMY  11-07-2004   dr Larrie Po @[facility]    THYROID  LOBECTOMY Right 08/15/2018   Procedure: RIGHT THYROID  LOBECTOMY;  Surgeon: Oralee Billow, MD;  Location: [facility];  Service: General;  Laterality: Right;   Family History  Problem Relation Age of Onset   Hypertension Mother    Heart disease Father    Cancer Father        bladder   Cancer Brother        throat   Colon cancer Neg Hx    Anesthesia problems Neg Hx    Colon polyps Neg Hx    Esophageal cancer Neg Hx    Stomach cancer Neg Hx    Rectal cancer Neg Hx    Social History   Socioeconomic History   Marital status: Married    Spouse name: Not on file   Number of children: Not on file   Years of education: Not on file   Highest education level: Not on file  Occupational History   Not on file  Tobacco Use   Smoking status: Former    Current packs/day: 0.00    Types: Cigarettes    Start date: 12/09/1971    Quit date: 12/09/2006    Years since quitting: 17.0   Smokeless tobacco: Never  Vaping Use   Vaping status: Never Used  Substance and Sexual Activity   Alcohol use: Yes    Comment: occasional   Drug use: Never   Sexual activity: Not Currently  Other Topics Concern   Not on file  Social History Narrative   Not on file    Social Drivers of Health   Financial Resource Strain: Low Risk  (12/09/2023)   Overall Financial Resource Strain (CARDIA)    Difficulty of Paying Living Expenses: Not hard at all  Food Insecurity: No Food Insecurity (12/09/2023)   Hunger Vital Sign    Worried About Running Out of Food in the Last Year: Never true    Ran Out of Food in the Last Year: Never true  Transportation Needs: No Transportation Needs (12/09/2023)   PRAPARE - Administrator, Civil Service (Medical): No    Lack of Transportation (Non-Medical):  No  Physical Activity: Insufficiently Active (12/09/2023)   Exercise Vital Sign    Days of Exercise per Week: 3 days    Minutes of Exercise per Session: 30 min  Stress: No Stress Concern Present (12/09/2023)   [employer] of Occupational Health - Occupational Stress Questionnaire    Feeling of Stress : Not at all  Social Connections: Socially Integrated (12/09/2023)   Social Connection and Isolation Panel [NHANES]    Frequency of Communication with Friends and Family: More than three times a week    Frequency of Social Gatherings with Friends and Family: Three times a week    Attends Religious Services: More than 4 times per year    Active Member of Clubs or Organizations: Yes    Attends Banker Meetings: 1 to 4 times per year    Marital Status: Married    Tobacco Counseling Counseling given: Not Answered    Clinical Intake:  Pre-visit preparation completed: Yes  Pain : No/denies pain  Diabetes: Yes CBG done?: No Did pt. bring in CBG monitor from home?: No  Lab Results  Component Value Date   HGBA1C 7.7 (H) 06/21/2023   HGBA1C 9.3 (H) 03/22/2023   HGBA1C 7.2 (H) 09/15/2022     How often do you need to have someone help you when you read instructions, pamphlets, or other written materials from your doctor or pharmacy?: 1 - Never  Interpreter Needed?: No  Information entered by :: Seabron Cypress LPN   Activities of Daily Living      12/09/2023    2:35 PM  In your present state of health, do you have any difficulty performing the following activities:  Hearing? 0  Vision? 0  Difficulty concentrating or making decisions? 0  Walking or climbing stairs? 0  Dressing or bathing? 0  Doing errands, shopping? 0  Preparing Food and eating ? N  Using the Toilet? N  In the past six months, have you accidently leaked urine? N  Do you have problems with loss of bowel control? N  Managing your Medications? N  Managing your Finances? N  Housekeeping or managing your Housekeeping? N    Patient Care Team: Cook, Jayce G, DO as PCP - General (Family Medicine) [facility]  I have updated your Care Teams any recent Medical Services you may have received from other providers in the past year.     Assessment:    This is a routine wellness examination for [place].  Hearing/Vision screen Hearing Screening - Comments:: Denies hearing difficulties   Vision Screening - Comments:: Wears rx glasses - up to date with routine eye exams with [facility].    Goals Addressed             This Visit's Progress    Remain active and independent   On track      Depression Screen     12/09/2023    2:36 PM 06/22/2023    9:51 AM 03/30/2023    1:14 PM 03/22/2023    8:47 AM 09/16/2022   10:18 AM 05/05/2022   10:54 AM 03/18/2022    8:19 AM  PHQ 2/9 Scores  PHQ - 2 Score 0 0 0 0 0 0 0  PHQ- 9 Score  0  0 1 0     Fall Risk     12/09/2023    2:37 PM 03/30/2023    1:13 PM 05/05/2022   10:54 AM 03/18/2022    8:18 AM 12/24/2020  10:22 AM  Fall Risk   Falls in the past year? 0 0 0 0 0  Number falls in past yr: 0 0 0 0 0  Injury with Fall? 0 0 0 0 0  Risk for fall due to : No Fall Risks  No Fall Risks No Fall Risks No Fall Risks  Follow up Falls prevention discussed;Education provided;Falls evaluation completed  Falls evaluation completed Falls evaluation completed Falls evaluation completed    MEDICARE RISK  AT HOME:  Medicare Risk at Home Any stairs in or around the home?: No If so, are there any without handrails?: No Home free of loose throw rugs in walkways, pet beds, electrical cords, etc?: Yes Adequate lighting in your home to reduce risk of falls?: Yes Life alert?: No Use of a cane, walker or w/c?: No Grab bars in the bathroom?: Yes Shower chair or bench in shower?: No Elevated toilet seat or a handicapped toilet?: Yes  TIMED UP AND GO:  Was the test performed?  No  Cognitive Function: 6CIT completed        12/09/2023    2:38 PM 05/05/2022   10:56 AM  6CIT Screen  What Year? 0 points 0 points  What month? 0 points 0 points  What time? 0 points 0 points  Count back from 20 0 points 0 points  Months in reverse 0 points 0 points  Repeat phrase 0 points 0 points  Total Score 0 points 0 points    Immunizations Immunization History  Administered Date(s) Administered   Influenza Split 05/08/2013   Influenza,inj,Quad PF,6+ Mos 05/02/2018, 05/29/2019   Influenza-Unspecified 04/03/2016, 05/03/2017   Moderna Sars-Covid-2 Vaccination 02/05/2020, 04/06/2020   Pneumococcal Polysaccharide-23 08/23/2013   Tdap 05/29/2019   Zoster Recombinant(Shingrix) 10/31/2017    Screening Tests Health Maintenance  Topic Date Due   Pneumonia Vaccine 67+ Years old (2 of 2 - PCV) 03/21/2024 (Originally 08/23/2014)   Zoster Vaccines- Shingrix (2 of 2) 06/06/2024 (Originally 12/26/2017)   HEMOGLOBIN A1C  12/20/2023   INFLUENZA VACCINE  02/03/2024   OPHTHALMOLOGY EXAM  02/22/2024   Diabetic kidney evaluation - eGFR measurement  03/21/2024   Diabetic kidney evaluation - Urine ACR  03/21/2024   FOOT EXAM  06/21/2024   Medicare Annual Wellness (AWV)  12/08/2024   Colonoscopy  08/15/2026   DTaP/Tdap/Td (2 - Td or Tdap) 05/28/2029   HPV VACCINES  Aged Out   Meningococcal B Vaccine  Aged Out   COVID-19 Vaccine  Discontinued   Hepatitis C Screening  Discontinued    Health Maintenance  There are  no preventive care reminders to display for this patient.   Additional Screening:  Vision Screening: Recommended annual ophthalmology exams for early detection of glaucoma and other disorders of the eye. Would you like a referral to an eye doctor? No    Dental Screening: Recommended annual dental exams for proper oral hygiene  Community Resource Referral / Chronic Care Management: CRR required this visit?  No   CCM required this visit?  No   Plan:    I have personally reviewed and noted the following in the patient's chart:   Medical and social history Use of alcohol, tobacco or illicit drugs  Current medications and supplements including opioid prescriptions. Patient is not currently taking opioid prescriptions. Functional ability and status Nutritional status Physical activity Advanced directives List of other physicians Hospitalizations, surgeries, and ER visits in previous 12 months Vitals Screenings to include cognitive, depression, and falls Referrals and appointments  In addition, I  have reviewed and discussed with patient certain preventive protocols, quality metrics, and best practice recommendations. A written personalized care plan for preventive services as well as general preventive health recommendations were provided to patient.   Seabron Cypress [place], [place]   03/11/2951   After Visit Summary: (MyChart) Due to this being a telephonic visit, the after visit summary with patients personalized plan was offered to patient via MyChart   Notes: Nothing significant to report at this time.

## 2023-12-09 NOTE — Telephone Encounter (Signed)
 Patient seen for AWV and is asking about labs for next office visit (01/05/24).  Asking if labs can be ordered and he be notified

## 2023-12-11 ENCOUNTER — Other Ambulatory Visit: Payer: Self-pay | Admitting: Family Medicine

## 2023-12-11 DIAGNOSIS — Z130 Encounter for screening for diseases of the blood and blood-forming organs and certain disorders involving the immune mechanism: Secondary | ICD-10-CM

## 2023-12-11 DIAGNOSIS — Z125 Encounter for screening for malignant neoplasm of prostate: Secondary | ICD-10-CM

## 2023-12-11 DIAGNOSIS — E890 Postprocedural hypothyroidism: Secondary | ICD-10-CM

## 2023-12-11 DIAGNOSIS — E119 Type 2 diabetes mellitus without complications: Secondary | ICD-10-CM

## 2023-12-11 DIAGNOSIS — E782 Mixed hyperlipidemia: Secondary | ICD-10-CM

## 2023-12-21 ENCOUNTER — Ambulatory Visit: Payer: PPO | Admitting: Family Medicine

## 2023-12-29 ENCOUNTER — Other Ambulatory Visit: Payer: Self-pay | Admitting: Family Medicine

## 2023-12-29 DIAGNOSIS — E890 Postprocedural hypothyroidism: Secondary | ICD-10-CM

## 2024-01-03 DIAGNOSIS — E890 Postprocedural hypothyroidism: Secondary | ICD-10-CM | POA: Diagnosis not present

## 2024-01-03 DIAGNOSIS — Z125 Encounter for screening for malignant neoplasm of prostate: Secondary | ICD-10-CM | POA: Diagnosis not present

## 2024-01-03 DIAGNOSIS — Z130 Encounter for screening for diseases of the blood and blood-forming organs and certain disorders involving the immune mechanism: Secondary | ICD-10-CM | POA: Diagnosis not present

## 2024-01-03 DIAGNOSIS — E782 Mixed hyperlipidemia: Secondary | ICD-10-CM | POA: Diagnosis not present

## 2024-01-03 DIAGNOSIS — E119 Type 2 diabetes mellitus without complications: Secondary | ICD-10-CM | POA: Diagnosis not present

## 2024-01-04 DIAGNOSIS — M9902 Segmental and somatic dysfunction of thoracic region: Secondary | ICD-10-CM | POA: Diagnosis not present

## 2024-01-04 DIAGNOSIS — M62830 Muscle spasm of back: Secondary | ICD-10-CM | POA: Diagnosis not present

## 2024-01-04 DIAGNOSIS — M9905 Segmental and somatic dysfunction of pelvic region: Secondary | ICD-10-CM | POA: Diagnosis not present

## 2024-01-04 DIAGNOSIS — M9903 Segmental and somatic dysfunction of lumbar region: Secondary | ICD-10-CM | POA: Diagnosis not present

## 2024-01-04 DIAGNOSIS — M546 Pain in thoracic spine: Secondary | ICD-10-CM | POA: Diagnosis not present

## 2024-01-04 LAB — HEMOGLOBIN A1C
Est. average glucose Bld gHb Est-mCnc: 194 mg/dL
Hgb A1c MFr Bld: 8.4 % — ABNORMAL HIGH (ref 4.8–5.6)

## 2024-01-04 LAB — LIPID PANEL
Chol/HDL Ratio: 5 ratio (ref 0.0–5.0)
Cholesterol, Total: 160 mg/dL (ref 100–199)
HDL: 32 mg/dL — ABNORMAL LOW (ref >39)
LDL Chol Calc (NIH): 82 mg/dL (ref 0–99)
Triglycerides: 281 mg/dL — ABNORMAL HIGH (ref 0–149)
VLDL Cholesterol Cal: 46 mg/dL — ABNORMAL HIGH (ref 5–40)

## 2024-01-04 LAB — CMP14+EGFR
ALT: 31 IU/L (ref 0–44)
AST: 27 IU/L (ref 0–40)
Albumin: 4.6 g/dL (ref 3.9–4.9)
Alkaline Phosphatase: 74 IU/L (ref 44–121)
BUN/Creatinine Ratio: 16 (ref 10–24)
BUN: 16 mg/dL (ref 8–27)
Bilirubin Total: 0.5 mg/dL (ref 0.0–1.2)
CO2: 22 mmol/L (ref 20–29)
Calcium: 9.4 mg/dL (ref 8.6–10.2)
Chloride: 100 mmol/L (ref 96–106)
Creatinine, Ser: 1.01 mg/dL (ref 0.76–1.27)
Globulin, Total: 2.3 g/dL (ref 1.5–4.5)
Glucose: 181 mg/dL — ABNORMAL HIGH (ref 70–99)
Potassium: 4.8 mmol/L (ref 3.5–5.2)
Sodium: 140 mmol/L (ref 134–144)
Total Protein: 6.9 g/dL (ref 6.0–8.5)
eGFR: 82 mL/min/{1.73_m2} (ref >59)

## 2024-01-04 LAB — CBC
Hematocrit: 44.7 % (ref 37.5–51.0)
Hemoglobin: 14.6 g/dL (ref 13.0–17.7)
MCH: 29.3 pg (ref 26.6–33.0)
MCHC: 32.7 g/dL (ref 31.5–35.7)
MCV: 90 fL (ref 79–97)
Platelets: 241 10*3/uL (ref 150–450)
RBC: 4.98 x10E6/uL (ref 4.14–5.80)
RDW: 12.5 % (ref 11.6–15.4)
WBC: 7.5 10*3/uL (ref 3.4–10.8)

## 2024-01-04 LAB — TSH: TSH: 3.57 u[IU]/mL (ref 0.450–4.500)

## 2024-01-04 LAB — PSA: Prostate Specific Ag, Serum: 1.2 ng/mL (ref 0.0–4.0)

## 2024-01-04 LAB — MICROALBUMIN / CREATININE URINE RATIO
Creatinine, Urine: 244.7 mg/dL
Microalb/Creat Ratio: 13 mg/g{creat} (ref 0–29)
Microalbumin, Urine: 31.5 ug/mL

## 2024-01-05 ENCOUNTER — Encounter: Payer: Self-pay | Admitting: Family Medicine

## 2024-01-05 ENCOUNTER — Other Ambulatory Visit: Payer: Self-pay | Admitting: Family Medicine

## 2024-01-05 ENCOUNTER — Telehealth: Payer: Self-pay

## 2024-01-05 ENCOUNTER — Ambulatory Visit: Admitting: Family Medicine

## 2024-01-05 VITALS — BP 114/72 | HR 74 | Temp 97.9°F | Ht 74.0 in | Wt 268.0 lb

## 2024-01-05 DIAGNOSIS — I10 Essential (primary) hypertension: Secondary | ICD-10-CM | POA: Diagnosis not present

## 2024-01-05 DIAGNOSIS — E782 Mixed hyperlipidemia: Secondary | ICD-10-CM

## 2024-01-05 DIAGNOSIS — E119 Type 2 diabetes mellitus without complications: Secondary | ICD-10-CM | POA: Diagnosis not present

## 2024-01-05 DIAGNOSIS — Z7984 Long term (current) use of oral hypoglycemic drugs: Secondary | ICD-10-CM | POA: Diagnosis not present

## 2024-01-05 DIAGNOSIS — K219 Gastro-esophageal reflux disease without esophagitis: Secondary | ICD-10-CM

## 2024-01-05 MED ORDER — METFORMIN HCL 1000 MG PO TABS: 1000.0000 mg | ORAL_TABLET | Freq: Two times a day (BID) | ORAL | 3 refills | Status: AC

## 2024-01-05 MED ORDER — ESOMEPRAZOLE MAGNESIUM 40 MG PO CPDR: DELAYED_RELEASE_CAPSULE | ORAL | 3 refills | Status: AC

## 2024-01-05 MED ORDER — LISINOPRIL 5 MG PO TABS: 5.0000 mg | ORAL_TABLET | Freq: Two times a day (BID) | ORAL | 3 refills | Status: AC

## 2024-01-05 MED ORDER — GLIPIZIDE 10 MG PO TABS: 10.0000 mg | ORAL_TABLET | Freq: Two times a day (BID) | ORAL | 3 refills | Status: AC

## 2024-01-05 MED ORDER — EMPAGLIFLOZIN 10 MG PO TABS: 10.0000 mg | ORAL_TABLET | Freq: Every day | ORAL | 1 refills | Status: DC

## 2024-01-05 NOTE — Assessment & Plan Note (Signed)
 -  Continue Lipitor

## 2024-01-05 NOTE — Telephone Encounter (Signed)
 Reason for CRM: The patient has a 3 month follow up scheduled on Thursday October 9th and would like an order for labs put in so he can get them drawn on the morning of Tuesday October 7th so he can go over them with his provider at his appointment. Please assist patient further

## 2024-01-05 NOTE — Patient Instructions (Signed)
 Medications sent.  Follow up in 3 months.  Take care  Dr. Bluford

## 2024-01-05 NOTE — Assessment & Plan Note (Signed)
 Stable. Continue lisinopril

## 2024-01-05 NOTE — Assessment & Plan Note (Addendum)
 Uncontrolled/worsening.  Adding Jardiance.

## 2024-01-05 NOTE — Progress Notes (Signed)
 Subjective:  Patient ID: Cristian Nguyen, male    DOB: 06/03/1956  Age: 68 y.o. MRN: [id number]  CC:   Chief Complaint  Patient presents with   Follow-up    6 month f/u DM2, lab results     HPI:  68 year old male presents for follow-up.  Labs reviewed today.  Patient's A1c has risen to 8.4.  He is compliant with metformin  and glipizide .  Will discuss additional pharmacotherapy today.  Fair control of lipids.  He is compliant with atorvastatin .  Hypertension well-controlled on lisinopril .  Denies chest pain or shortness of breath.  Staying active.  Playing golf 2-3 times a week.  Patient Active Problem List   Diagnosis Date Noted   Preventative health care 03/18/2022   Hyperlipidemia, mixed 09/16/2021   Post-operative hypothyroidism 06/15/2021   S/P subtotal thyroidectomy 09/01/2018   History of colon polyps    Type 2 diabetes mellitus without complication (HCC) 04/27/2015   GERD (gastroesophageal reflux disease) 05/08/2013   Essential hypertension, benign 05/08/2013    Social Hx   Social History   Socioeconomic History   Marital status: Married    Spouse name: Not on file   Number of children: Not on file   Years of education: Not on file   Highest education level: Not on file  Occupational History   Not on file  Tobacco Use   Smoking status: Former    Current packs/day: 0.00    Types: Cigarettes    Start date: 12/09/1971    Quit date: 12/09/2006    Years since quitting: 17.0   Smokeless tobacco: Never  Vaping Use   Vaping status: Never Used  Substance and Sexual Activity   Alcohol use: Yes    Comment: occasional   Drug use: Never   Sexual activity: Not Currently  Other Topics Concern   Not on file  Social History Narrative   Not on file   Social Drivers of Health   Financial Resource Strain: Low Risk  (12/09/2023)   Overall Financial Resource Strain (CARDIA)    Difficulty of Paying Living Expenses: Not hard at all  Food Insecurity: No Food Insecurity  (12/09/2023)   Hunger Vital Sign    Worried About Running Out of Food in the Last Year: Never true    Ran Out of Food in the Last Year: Never true  Transportation Needs: No Transportation Needs (12/09/2023)   PRAPARE - Administrator, Civil Service (Medical): No    Lack of Transportation (Non-Medical): No  Physical Activity: Insufficiently Active (12/09/2023)   Exercise Vital Sign    Days of Exercise per Week: 3 days    Minutes of Exercise per Session: 30 min  Stress: No Stress Concern Present (12/09/2023)   [employer] of Occupational Health - Occupational Stress Questionnaire    Feeling of Stress : Not at all  Social Connections: Socially Integrated (12/09/2023)   Social Connection and Isolation Panel    Frequency of Communication with Friends and Family: More than three times a week    Frequency of Social Gatherings with Friends and Family: Three times a week    Attends Religious Services: More than 4 times per year    Active Member of Clubs or Organizations: Yes    Attends Banker Meetings: 1 to 4 times per year    Marital Status: Married    Review of Systems Per HPI  Objective:  BP 114/72   Pulse 74   Temp 97.9 F (36.6 C)  Ht 6' 2 (1.88 m)   Wt 268 lb (121.6 kg)   SpO2 97%   BMI 34.41 kg/m      01/05/2024    8:48 AM 12/09/2023    2:35 PM 08/16/2023   10:35 AM  BP/Weight  Systolic BP 114 -- 112  Diastolic BP 72 -- 76  Wt. (Lbs) 268 260   BMI 34.41 kg/m2 33.38 kg/m2     Physical Exam Vitals and nursing note reviewed.  Constitutional:      General: He is not in acute distress.    Appearance: Normal appearance.  HENT:     Head: Normocephalic and atraumatic.  Eyes:     General:        Right eye: No discharge.        Left eye: No discharge.     Conjunctiva/sclera: Conjunctivae normal.  Cardiovascular:     Rate and Rhythm: Normal rate and regular rhythm.  Pulmonary:     Effort: Pulmonary effort is normal.     Breath sounds: Normal  breath sounds. No wheezing, rhonchi or rales.  Neurological:     Mental Status: He is alert.  Psychiatric:        Mood and Affect: Mood normal.        Behavior: Behavior normal.     Lab Results  Component Value Date   WBC 7.5 01/03/2024   HGB 14.6 01/03/2024   HCT 44.7 01/03/2024   PLT 241 01/03/2024   GLUCOSE 181 (H) 01/03/2024   CHOL 160 01/03/2024   TRIG 281 (H) 01/03/2024   HDL 32 (L) 01/03/2024   LDLCALC 82 01/03/2024   ALT 31 01/03/2024   AST 27 01/03/2024   NA 140 01/03/2024   K 4.8 01/03/2024   CL 100 01/03/2024   CREATININE 1.01 01/03/2024   BUN 16 01/03/2024   CO2 22 01/03/2024   TSH 3.570 01/03/2024   PSA 1.14 05/02/2013   HGBA1C 8.4 (H) 01/03/2024     Assessment & Plan:  Type 2 diabetes mellitus without complication, without long-term current use of insulin (HCC) Assessment & Plan: Uncontrolled/worsening.  Adding Jardiance.  Orders: -     Empagliflozin; Take 1 tablet (10 mg total) by mouth daily before breakfast.  Dispense: 90 tablet; Refill: 1 -     glipiZIDE ; Take 1 tablet (10 mg total) by mouth 2 (two) times daily before a meal.  Dispense: 180 tablet; Refill: 3 -     metFORMIN  HCl; Take 1 tablet (1,000 mg total) by mouth 2 (two) times daily with a meal.  Dispense: 180 tablet; Refill: 3  Essential hypertension, benign Assessment & Plan: Stable.  Continue lisinopril .  Orders: -     Lisinopril ; Take 1 tablet (5 mg total) by mouth 2 (two) times daily.  Dispense: 180 tablet; Refill: 3  Gastroesophageal reflux disease without esophagitis -     Esomeprazole  Magnesium ; TAKE ONE CAPSULE BY MOUTH ONCE DAILY BEFORE BREAKFAST  Dispense: 90 capsule; Refill: 3  Hyperlipidemia, mixed Assessment & Plan: Continue Lipitor.     Follow-up:  3 months  Deaven Urwin Bluford DO [facility]

## 2024-04-06 ENCOUNTER — Ambulatory Visit: Admitting: Family Medicine

## 2024-04-09 DIAGNOSIS — M9902 Segmental and somatic dysfunction of thoracic region: Secondary | ICD-10-CM | POA: Diagnosis not present

## 2024-04-09 DIAGNOSIS — M9903 Segmental and somatic dysfunction of lumbar region: Secondary | ICD-10-CM | POA: Diagnosis not present

## 2024-04-09 DIAGNOSIS — M62830 Muscle spasm of back: Secondary | ICD-10-CM | POA: Diagnosis not present

## 2024-04-09 DIAGNOSIS — M9905 Segmental and somatic dysfunction of pelvic region: Secondary | ICD-10-CM | POA: Diagnosis not present

## 2024-04-09 DIAGNOSIS — E119 Type 2 diabetes mellitus without complications: Secondary | ICD-10-CM | POA: Diagnosis not present

## 2024-04-09 DIAGNOSIS — M546 Pain in thoracic spine: Secondary | ICD-10-CM | POA: Diagnosis not present

## 2024-04-10 LAB — HEMOGLOBIN A1C
Est. average glucose Bld gHb Est-mCnc: 169 mg/dL
Hgb A1c MFr Bld: 7.5 % — ABNORMAL HIGH (ref 4.8–5.6)

## 2024-04-11 ENCOUNTER — Ambulatory Visit: Admitting: Family Medicine

## 2024-04-12 ENCOUNTER — Ambulatory Visit: Admitting: Family Medicine

## 2024-04-19 ENCOUNTER — Ambulatory Visit: Admitting: Family Medicine

## 2024-04-19 VITALS — BP 116/78 | HR 74 | Ht 74.0 in | Wt 260.4 lb

## 2024-04-19 DIAGNOSIS — Z7984 Long term (current) use of oral hypoglycemic drugs: Secondary | ICD-10-CM | POA: Diagnosis not present

## 2024-04-19 DIAGNOSIS — E782 Mixed hyperlipidemia: Secondary | ICD-10-CM | POA: Diagnosis not present

## 2024-04-19 DIAGNOSIS — E119 Type 2 diabetes mellitus without complications: Secondary | ICD-10-CM | POA: Diagnosis not present

## 2024-04-19 DIAGNOSIS — E890 Postprocedural hypothyroidism: Secondary | ICD-10-CM

## 2024-04-19 DIAGNOSIS — I10 Essential (primary) hypertension: Secondary | ICD-10-CM

## 2024-04-19 MED ORDER — EMPAGLIFLOZIN 25 MG PO TABS: 25.0000 mg | ORAL_TABLET | Freq: Every day | ORAL | 3 refills | Status: AC

## 2024-04-19 NOTE — Assessment & Plan Note (Signed)
 A1c not yet at goal.  Increasing Jardiance .

## 2024-04-19 NOTE — Assessment & Plan Note (Signed)
 Continue current dosing of levothyroxine.

## 2024-04-19 NOTE — Assessment & Plan Note (Signed)
 Stable. Continue lisinopril

## 2024-04-19 NOTE — Progress Notes (Signed)
 Subjective:  Patient ID: Cristian Nguyen, male    DOB: 07/23/1955  Age: 68 y.o. MRN: [id number]  CC:  Follow up   HPI:  68 year old male presents for follow-up.  Patient states that overall he is doing well.  Blood pressure is well-controlled on lisinopril .  A1c has improved to 7.5 from 8.4.  He is on Jardiance , glipizide , and metformin .  Will discuss increasing Jardiance .  Lipids fairly well-controlled with an LDL of 82.  Compliant with Crestor .  Hypothyroidism stable on 50 mcg of levothyroxine .  Denies chest pain or shortness of breath.    Patient Active Problem List   Diagnosis Date Noted   Preventative health care 03/18/2022   Hyperlipidemia, mixed 09/16/2021   Post-operative hypothyroidism 06/15/2021   S/P subtotal thyroidectomy 09/01/2018   History of colon polyps    Type 2 diabetes mellitus without complications (HCC) 04/27/2015   GERD (gastroesophageal reflux disease) 05/08/2013   Essential hypertension, benign 05/08/2013    Social Hx   Social History   Socioeconomic History   Marital status: Married    Spouse name: Not on file   Number of children: Not on file   Years of education: Not on file   Highest education level: Not on file  Occupational History   Not on file  Tobacco Use   Smoking status: Former    Current packs/day: 0.00    Types: Cigarettes    Start date: 12/09/1971    Quit date: 12/09/2006    Years since quitting: 17.3   Smokeless tobacco: Never  Vaping Use   Vaping status: Never Used  Substance and Sexual Activity   Alcohol use: Yes    Comment: occasional   Drug use: Never   Sexual activity: Not Currently  Other Topics Concern   Not on file  Social History Narrative   Not on file   Social Drivers of Health   Financial Resource Strain: Low Risk  (12/09/2023)   Overall Financial Resource Strain (CARDIA)    Difficulty of Paying Living Expenses: Not hard at all  Food Insecurity: No Food Insecurity (12/09/2023)   Hunger Vital Sign     Worried About Running Out of Food in the Last Year: Never true    Ran Out of Food in the Last Year: Never true  Transportation Needs: No Transportation Needs (12/09/2023)   PRAPARE - Administrator, Civil Service (Medical): No    Lack of Transportation (Non-Medical): No  Physical Activity: Insufficiently Active (12/09/2023)   Exercise Vital Sign    Days of Exercise per Week: 3 days    Minutes of Exercise per Session: 30 min  Stress: No Stress Concern Present (12/09/2023)   [employer] of Occupational Health - Occupational Stress Questionnaire    Feeling of Stress : Not at all  Social Connections: Socially Integrated (12/09/2023)   Social Connection and Isolation Panel    Frequency of Communication with Friends and Family: More than three times a week    Frequency of Social Gatherings with Friends and Family: Three times a week    Attends Religious Services: More than 4 times per year    Active Member of Clubs or Organizations: Yes    Attends Banker Meetings: 1 to 4 times per year    Marital Status: Married    Review of Systems Per HPI  Objective:  BP 116/78   Pulse 74   Ht 6' 2 (1.88 m)   Wt 260 lb 6 oz (118.1 kg)  BMI 33.43 kg/m      04/19/2024    9:25 AM 04/19/2024    9:07 AM 01/05/2024    8:48 AM  BP/Weight  Systolic BP 116 148 114  Diastolic BP 78 83 72  Wt. (Lbs)  260.38 268  BMI  33.43 kg/m2 34.41 kg/m2    Physical Exam Vitals and nursing note reviewed.  Constitutional:      General: He is not in acute distress.    Appearance: Normal appearance.  HENT:     Head: Normocephalic and atraumatic.  Eyes:     General:        Right eye: No discharge.        Left eye: No discharge.     Conjunctiva/sclera: Conjunctivae normal.  Cardiovascular:     Rate and Rhythm: Normal rate and regular rhythm.  Pulmonary:     Effort: Pulmonary effort is normal.     Breath sounds: Normal breath sounds. No wheezing, rhonchi or rales.  Neurological:      Mental Status: He is alert.  Psychiatric:        Mood and Affect: Mood normal.        Behavior: Behavior normal.     Lab Results  Component Value Date   WBC 7.5 01/03/2024   HGB 14.6 01/03/2024   HCT 44.7 01/03/2024   PLT 241 01/03/2024   GLUCOSE 181 (H) 01/03/2024   CHOL 160 01/03/2024   TRIG 281 (H) 01/03/2024   HDL 32 (L) 01/03/2024   LDLCALC 82 01/03/2024   ALT 31 01/03/2024   AST 27 01/03/2024   NA 140 01/03/2024   K 4.8 01/03/2024   CL 100 01/03/2024   CREATININE 1.01 01/03/2024   BUN 16 01/03/2024   CO2 22 01/03/2024   TSH 3.570 01/03/2024   PSA 1.14 05/02/2013   HGBA1C 7.5 (H) 04/09/2024     Assessment & Plan:  Essential hypertension, benign Assessment & Plan: Stable.  Continue lisinopril .   Type 2 diabetes mellitus without complication, without long-term current use of insulin (HCC) Assessment & Plan: A1c not yet at goal.  Increasing Jardiance .  Orders: -     Empagliflozin ; Take 1 tablet (25 mg total) by mouth daily before breakfast.  Dispense: 90 tablet; Refill: 3  Hyperlipidemia, mixed Assessment & Plan: Stable.  Continue statin.   Post-operative hypothyroidism Assessment & Plan: Continue current dosing of levothyroxine .     Follow-up: 6 months  Ara Mano Bluford DO [facility]

## 2024-04-19 NOTE — Patient Instructions (Signed)
Follow up in 6 months.  Take care  Dr. Wren Gallaga  

## 2024-04-19 NOTE — Assessment & Plan Note (Signed)
 Stable.  Continue statin.

## 2024-06-29 ENCOUNTER — Other Ambulatory Visit: Payer: Self-pay | Admitting: Family Medicine

## 2024-06-29 DIAGNOSIS — E890 Postprocedural hypothyroidism: Secondary | ICD-10-CM

## 2024-10-18 ENCOUNTER — Ambulatory Visit: Admitting: Family Medicine

## 2024-12-14 ENCOUNTER — Ambulatory Visit
